# Patient Record
Sex: Male | Born: 1980
Health system: Southern US, Community
[De-identification: ages and names within clinical notes are randomized; demographics above are authoritative.]

## PROBLEM LIST (undated history)

## (undated) DIAGNOSIS — G4700 Insomnia, unspecified: Secondary | ICD-10-CM

## (undated) DIAGNOSIS — F419 Anxiety disorder, unspecified: Secondary | ICD-10-CM

## (undated) DIAGNOSIS — M549 Dorsalgia, unspecified: Secondary | ICD-10-CM

## (undated) DIAGNOSIS — G8929 Other chronic pain: Secondary | ICD-10-CM

## (undated) DIAGNOSIS — K219 Gastro-esophageal reflux disease without esophagitis: Secondary | ICD-10-CM

## (undated) DIAGNOSIS — Z87442 Personal history of urinary calculi: Secondary | ICD-10-CM

## (undated) DIAGNOSIS — E785 Hyperlipidemia, unspecified: Secondary | ICD-10-CM

## (undated) DIAGNOSIS — R531 Weakness: Secondary | ICD-10-CM

## (undated) DIAGNOSIS — I10 Essential (primary) hypertension: Secondary | ICD-10-CM

## (undated) HISTORY — PX: HERNIA REPAIR: SHX51

---

## 2003-01-11 ENCOUNTER — Ambulatory Visit (HOSPITAL_COMMUNITY): Admission: RE | Admit: 2003-01-11 | Discharge: 2003-01-11 | Payer: Self-pay | Admitting: General Surgery

## 2003-01-17 ENCOUNTER — Emergency Department (HOSPITAL_COMMUNITY): Admission: EM | Admit: 2003-01-17 | Discharge: 2003-01-17 | Payer: Self-pay | Admitting: Emergency Medicine

## 2005-05-24 ENCOUNTER — Ambulatory Visit: Payer: Self-pay | Admitting: Family Medicine

## 2006-04-02 ENCOUNTER — Emergency Department: Payer: Self-pay | Admitting: Emergency Medicine

## 2014-03-10 ENCOUNTER — Emergency Department (HOSPITAL_BASED_OUTPATIENT_CLINIC_OR_DEPARTMENT_OTHER): Payer: BC Managed Care – PPO

## 2014-03-10 ENCOUNTER — Encounter (HOSPITAL_BASED_OUTPATIENT_CLINIC_OR_DEPARTMENT_OTHER): Payer: Self-pay | Admitting: Emergency Medicine

## 2014-03-10 ENCOUNTER — Emergency Department (HOSPITAL_BASED_OUTPATIENT_CLINIC_OR_DEPARTMENT_OTHER)
Admission: EM | Admit: 2014-03-10 | Discharge: 2014-03-11 | Disposition: A | Discharge status: Home / Self Care | Payer: BC Managed Care – PPO | Attending: Emergency Medicine | Admitting: Emergency Medicine

## 2014-03-10 DIAGNOSIS — Z79899 Other long term (current) drug therapy: Secondary | ICD-10-CM | POA: Insufficient documentation

## 2014-03-10 DIAGNOSIS — R1011 Right upper quadrant pain: Secondary | ICD-10-CM

## 2014-03-10 DIAGNOSIS — R1013 Epigastric pain: Secondary | ICD-10-CM

## 2014-03-10 DIAGNOSIS — I10 Essential (primary) hypertension: Secondary | ICD-10-CM | POA: Insufficient documentation

## 2014-03-10 DIAGNOSIS — Z880 Allergy status to penicillin: Secondary | ICD-10-CM | POA: Insufficient documentation

## 2014-03-10 DIAGNOSIS — E785 Hyperlipidemia, unspecified: Secondary | ICD-10-CM | POA: Insufficient documentation

## 2014-03-10 HISTORY — DX: Hyperlipidemia, unspecified: E78.5

## 2014-03-10 HISTORY — DX: Essential (primary) hypertension: I10

## 2014-03-10 LAB — CBC WITH DIFFERENTIAL/PLATELET
Basophils Absolute: 0 10*3/uL (ref 0.0–0.1)
Basophils Relative: 0 % (ref 0–1)
EOS ABS: 0.3 10*3/uL (ref 0.0–0.7)
Eosinophils Relative: 3 % (ref 0–5)
HCT: 44.9 % (ref 39.0–52.0)
Hemoglobin: 15.6 g/dL (ref 13.0–17.0)
Lymphocytes Relative: 31 % (ref 12–46)
Lymphs Abs: 3.3 10*3/uL (ref 0.7–4.0)
MCH: 30.7 pg (ref 26.0–34.0)
MCHC: 34.7 g/dL (ref 30.0–36.0)
MCV: 88.4 fL (ref 78.0–100.0)
MONOS PCT: 9 % (ref 3–12)
Monocytes Absolute: 1 10*3/uL (ref 0.1–1.0)
NEUTROS ABS: 6.2 10*3/uL (ref 1.7–7.7)
Neutrophils Relative %: 57 % (ref 43–77)
Platelets: 212 10*3/uL (ref 150–400)
RBC: 5.08 MIL/uL (ref 4.22–5.81)
RDW: 12.7 % (ref 11.5–15.5)
WBC: 10.8 10*3/uL — ABNORMAL HIGH (ref 4.0–10.5)

## 2014-03-10 LAB — COMPREHENSIVE METABOLIC PANEL
ALK PHOS: 53 U/L (ref 39–117)
ALT: 33 U/L (ref 0–53)
AST: 26 U/L (ref 0–37)
Albumin: 4.5 g/dL (ref 3.5–5.2)
BILIRUBIN TOTAL: 0.3 mg/dL (ref 0.3–1.2)
BUN: 17 mg/dL (ref 6–23)
CHLORIDE: 100 meq/L (ref 96–112)
CO2: 29 meq/L (ref 19–32)
Calcium: 10.3 mg/dL (ref 8.4–10.5)
Creatinine, Ser: 1.2 mg/dL (ref 0.50–1.35)
GFR, EST NON AFRICAN AMERICAN: 79 mL/min — AB (ref >90)
GLUCOSE: 110 mg/dL — AB (ref 70–99)
POTASSIUM: 4.1 meq/L (ref 3.7–5.3)
Sodium: 142 mEq/L (ref 137–147)
Total Protein: 7.7 g/dL (ref 6.0–8.3)

## 2014-03-10 LAB — LIPASE, BLOOD: Lipase: 41 U/L (ref 11–59)

## 2014-03-10 MED ORDER — FAMOTIDINE IN NACL 20-0.9 MG/50ML-% IV SOLN
20.0000 mg | Freq: Once | INTRAVENOUS | Status: AC
  Administered 2014-03-10: 20 mg via INTRAVENOUS
  Filled 2014-03-10: qty 50

## 2014-03-10 MED ORDER — SODIUM CHLORIDE 0.9 % IV SOLN
INTRAVENOUS | Status: DC
  Administered 2014-03-10: 23:00:00 via INTRAVENOUS

## 2014-03-10 MED ORDER — CYCLOBENZAPRINE HCL 10 MG PO TABS: 10.0000 mg | ORAL_TABLET | Freq: Two times a day (BID) | ORAL | Status: DC | PRN

## 2014-03-10 MED ORDER — RANITIDINE HCL 150 MG PO TABS: 150.0000 mg | ORAL_TABLET | Freq: Two times a day (BID) | ORAL | Status: DC

## 2014-03-10 NOTE — ED Provider Notes (Signed)
Medical screening examination/treatment/procedure(s) were performed by non-physician practitioner and as supervising physician I was immediately available for consultation/collaboration.   EKG Interpretation None        Gilda Creasehristopher J. Simcha Farrington, MD 03/10/14 [phone]

## 2014-03-10 NOTE — ED Provider Notes (Signed)
CSN: [id number]     Arrival date & time 03/10/14  2137 History   First MD Initiated Contact with Patient 03/10/14 2207     Chief Complaint  Patient presents with  . Abdominal Pain     (Consider location/radiation/quality/duration/timing/severity/associated sxs/prior Treatment) Patient is a 33 y.o. male presenting with abdominal pain. The history is provided by the patient.  Abdominal Pain Pain location:  RUQ Pain quality: aching and burning   Pain radiates to:  Epigastric region Onset quality:  Gradual Duration:  1 week Timing:  Constant Progression:  Worsening Chronicity:  New Relieved by:  None tried Worsened by:  Eating Ineffective treatments:  None tried Associated symptoms: no chest pain, no chills, no cough, no diarrhea, no dysuria, no fever, no nausea, no shortness of breath and no vomiting    Thomas York is a 33 y.o. male who presents to the ED with RUQ abdominal pain that started a week ago and has gotten worse. He denies nausea, vomiting or diarrhea. The pain is worse with eating greasy food. 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 Not sure if pulled a muscle or has reflux or something else. The pain increases with deep breath and movement.  Past Medical History  Diagnosis Date  . Hypertension   . Hyperlipemia    Past Surgical History  Procedure Laterality Date  . Hernia repair     History reviewed. No pertinent family history. History  Substance Use Topics  . Smoking status: Never Smoker   . Smokeless tobacco: Never Used  . Alcohol Use: Yes    Review of Systems  Constitutional: Negative for fever and chills.  HENT: Negative.   Respiratory: Negative for cough and shortness of breath.   Cardiovascular: Negative for chest pain.  Gastrointestinal: Positive for abdominal pain. Negative for nausea, vomiting and diarrhea.  Genitourinary: Negative for dysuria, urgency and frequency.  Musculoskeletal: Negative for back pain.  Skin: Negative  for rash.  Neurological: Negative for headaches.  Psychiatric/Behavioral: Negative for confusion.      Allergies  Penicillins  Home Medications   Current Outpatient Rx  Name  Route  Sig  Dispense  Refill  . lisinopril (PRINIVIL,ZESTRIL) 10 MG tablet   Oral   Take 10 mg by mouth daily.         . simvastatin (ZOCOR) 20 MG tablet   Oral   Take 20 mg by mouth daily.          BP 144/72  Pulse 71  Temp(Src) 98.3 F (36.8 C) (Oral)  Resp 18  SpO2 100% Physical Exam  Nursing note and vitals reviewed. Constitutional: He is oriented to person, place, and time. He appears well-developed and well-nourished. No distress.  HENT:  Head: Normocephalic.  Eyes: EOM are normal.  Neck: Neck supple.  Cardiovascular: Normal rate and regular rhythm.   Pulmonary/Chest: Effort normal. He has no wheezes. He has no rales.  Abdominal: Soft. Bowel sounds are normal. There is tenderness in the right upper quadrant and epigastric area. There is no rebound, no guarding and no CVA tenderness.  Musculoskeletal: Normal range of motion.  Neurological: He is alert and oriented to person, place, and time. No cranial nerve deficit.  Skin: Skin is warm and dry.  Psychiatric: He has a normal mood and affect. His behavior is normal.   Results for orders placed during the hospital encounter of 03/10/14 (from the past 24 hour(s))  CBC WITH DIFFERENTIAL  Status: Abnormal   Collection Time    03/10/14 10:26 PM      Result Value Ref Range   WBC 10.8 (*) 4.0 - 10.5 K/uL   RBC 5.08  4.22 - 5.81 MIL/uL   Hemoglobin 15.6  13.0 - 17.0 g/dL   HCT 16.1  09.6 - 04.5 %   MCV 88.4  78.0 - 100.0 fL   MCH 30.7  26.0 - 34.0 pg   MCHC 34.7  30.0 - 36.0 g/dL   RDW 40.9  81.1 - 91.4 %   Platelets 212  150 - 400 K/uL   Neutrophils Relative % 57  43 - 77 %   Neutro Abs 6.2  1.7 - 7.7 K/uL   Lymphocytes Relative 31  12 - 46 %   Lymphs Abs 3.3  0.7 - 4.0 K/uL   Monocytes Relative 9  3 - 12 %   Monocytes Absolute  1.0  0.1 - 1.0 K/uL   Eosinophils Relative 3  0 - 5 %   Eosinophils Absolute 0.3  0.0 - 0.7 K/uL   Basophils Relative 0  0 - 1 %   Basophils Absolute 0.0  0.0 - 0.1 K/uL  COMPREHENSIVE METABOLIC PANEL     Status: Abnormal   Collection Time    03/10/14 10:26 PM      Result Value Ref Range   Sodium 142  137 - 147 mEq/L   Potassium 4.1  3.7 - 5.3 mEq/L   Chloride 100  96 - 112 mEq/L   CO2 29  19 - 32 mEq/L   Glucose, Bld 110 (*) 70 - 99 mg/dL   BUN 17  6 - 23 mg/dL   Creatinine, Ser 7.82  0.50 - 1.35 mg/dL   Calcium 95.6  8.4 - 21.3 mg/dL   Total Protein 7.7  6.0 - 8.3 g/dL   Albumin 4.5  3.5 - 5.2 g/dL   AST 26  0 - 37 U/L   ALT 33  0 - 53 U/L   Alkaline Phosphatase 53  39 - 117 U/L   Total Bilirubin 0.3  0.3 - 1.2 mg/dL   GFR calc non Af Amer 79 (*) >90 mL/min   GFR calc Af Amer >90  >90 mL/min  LIPASE, BLOOD     Status: None   Collection Time    03/10/14 10:26 PM      Result Value Ref Range   Lipase 41  11 - 59 U/L   US Abdomen Complete  03/10/2014   CLINICAL DATA:  One week history of right upper quadrant pain  EXAM: ULTRASOUND ABDOMEN COMPLETE  COMPARISON:  Prior abdominal radiograph 08/08/2012  FINDINGS: Gallbladder:  No gallstones or wall thickening visualized. No sonographic Murphy sign noted. The gallbladder is slightly contracted. The patient last ate at 6 p.m.  Common bile duct:  Diameter: 2.8 mm which is within normal limits.  Liver:  No focal lesion identified. Within normal limits in parenchymal echogenicity. The main portal vein is patent with normal hepatopetal flow.  IVC:  No abnormality visualized.  Pancreas:  Visualized portion unremarkable.  Spleen:  Size and appearance within normal limits.  Right Kidney:  Length: 12.4 cm. Echogenicity within normal limits. No mass or hydronephrosis visualized.  Left Kidney:  Length: 12.2 cm. Echogenicity within normal limits. No mass or hydronephrosis visualized.  Abdominal aorta:  No aneurysm visualized.  Other findings:  None.   IMPRESSION: Negative abdominal ultrasound.   Electronically Signed   By: Isac Caddy.D.  On: 03/10/2014 23:02     ED Course  Procedures IV pepcid that did help the epigastric pain  MDM  33 y.o. male with RUQ abdominal pain that started one week ago. I have reviewed this patient's vital signs, nurses notes, appropriate labs and imaging. Will treat for reflux an possible muscle strain.  I have discussed findings and plan of care with the patient and he voices understanding. Will start on Zantac and will give muscle relaxant.    Medication List    TAKE these medications       cyclobenzaprine 10 MG tablet  Commonly known as:  FLEXERIL  Take 1 tablet (10 mg total) by mouth 2 (two) times daily as needed for muscle spasms.     ranitidine 150 MG tablet  Commonly known as:  ZANTAC  Take 1 tablet (150 mg total) by mouth 2 (two) times daily.      ASK your doctor about these medications       lisinopril 10 MG tablet  Commonly known as:  PRINIVIL,ZESTRIL  Take 10 mg by mouth daily.     simvastatin 20 MG tablet  Commonly known as:  ZOCOR  Take 20 mg by mouth daily.          [place] M Brynda Heick, [place]NP 03/10/14 [phone]

## 2014-03-10 NOTE — Discharge Instructions (Signed)
Take the medication as directed. Follow up with your doctor. Do not take the muscle relaxant if you are driving as it will make you sleepy.

## 2014-03-10 NOTE — ED Notes (Signed)
Pt reports abd soreness x1 week that has progress to RUQ abd pain denies N/V/D, or event or injury to abd

## 2014-12-15 ENCOUNTER — Encounter (HOSPITAL_COMMUNITY): Payer: Self-pay | Admitting: *Deleted

## 2014-12-15 ENCOUNTER — Emergency Department (HOSPITAL_COMMUNITY)
Admission: EM | Admit: 2014-12-15 | Discharge: 2014-12-15 | Disposition: A | Discharge status: Home / Self Care | Payer: BC Managed Care – PPO | Attending: Emergency Medicine | Admitting: Emergency Medicine

## 2014-12-15 DIAGNOSIS — M5441 Lumbago with sciatica, right side: Secondary | ICD-10-CM | POA: Diagnosis not present

## 2014-12-15 DIAGNOSIS — M5431 Sciatica, right side: Secondary | ICD-10-CM

## 2014-12-15 DIAGNOSIS — Z880 Allergy status to penicillin: Secondary | ICD-10-CM | POA: Diagnosis not present

## 2014-12-15 DIAGNOSIS — Z79899 Other long term (current) drug therapy: Secondary | ICD-10-CM | POA: Insufficient documentation

## 2014-12-15 DIAGNOSIS — M545 Low back pain, unspecified: Secondary | ICD-10-CM

## 2014-12-15 DIAGNOSIS — M79604 Pain in right leg: Secondary | ICD-10-CM | POA: Insufficient documentation

## 2014-12-15 DIAGNOSIS — R200 Anesthesia of skin: Secondary | ICD-10-CM | POA: Diagnosis not present

## 2014-12-15 DIAGNOSIS — E785 Hyperlipidemia, unspecified: Secondary | ICD-10-CM | POA: Insufficient documentation

## 2014-12-15 DIAGNOSIS — Z791 Long term (current) use of non-steroidal anti-inflammatories (NSAID): Secondary | ICD-10-CM | POA: Diagnosis not present

## 2014-12-15 DIAGNOSIS — I10 Essential (primary) hypertension: Secondary | ICD-10-CM | POA: Insufficient documentation

## 2014-12-15 MED ORDER — OXYCODONE-ACETAMINOPHEN 5-325 MG PO TABS
1.0000 | ORAL_TABLET | Freq: Once | ORAL | Status: AC
  Administered 2014-12-15: 1 via ORAL
  Filled 2014-12-15: qty 1

## 2014-12-15 MED ORDER — DIAZEPAM 5 MG PO TABS: 5.0000 mg | ORAL_TABLET | Freq: Four times a day (QID) | ORAL | Status: DC | PRN

## 2014-12-15 MED ORDER — HYDROMORPHONE HCL 1 MG/ML IJ SOLN
1.0000 mg | Freq: Once | INTRAMUSCULAR | Status: AC
  Administered 2014-12-15: 1 mg via INTRAMUSCULAR
  Filled 2014-12-15: qty 1

## 2014-12-15 MED ORDER — HYDROCODONE-ACETAMINOPHEN 5-325 MG PO TABS: 2.0000 | ORAL_TABLET | ORAL | Status: DC | PRN

## 2014-12-15 MED ORDER — NAPROXEN 500 MG PO TABS: 500.0000 mg | ORAL_TABLET | Freq: Two times a day (BID) | ORAL | Status: DC

## 2014-12-15 MED ORDER — DIAZEPAM 5 MG PO TABS
5.0000 mg | ORAL_TABLET | Freq: Once | ORAL | Status: AC
  Administered 2014-12-15: 5 mg via ORAL
  Filled 2014-12-15: qty 1

## 2014-12-15 MED ORDER — KETOROLAC TROMETHAMINE 60 MG/2ML IM SOLN
60.0000 mg | Freq: Once | INTRAMUSCULAR | Status: AC
  Administered 2014-12-15: 60 mg via INTRAMUSCULAR
  Filled 2014-12-15: qty 2

## 2014-12-15 NOTE — ED Provider Notes (Signed)
CSN: [id number]     Arrival date & time 12/15/14  1410 History   First MD Initiated Contact with Patient 12/15/14 1727     Chief Complaint  Patient presents with  . Leg Pain     (Consider location/radiation/quality/duration/timing/severity/associated sxs/prior Treatment) HPI Comments: 34 year old male with history of lumbar disc herniation, high blood pressure, nonsmoker presents with right buttock pain radiating down the right leg to his foot. No weakness, mild numbness and lateral lower leg, new urinary or bowel changes, no fevers or IV drug use history, no recent injuries or back surgery. Patient has tried narcotics however pain is worsening. Worse with movement palpation.  Patient is a 35 y.o. male presenting with leg pain. The history is provided by the patient.  Leg Pain Associated symptoms: back pain   Associated symptoms: no fever and no neck pain     Past Medical History  Diagnosis Date  . Hypertension   . Hyperlipemia    Past Surgical History  Procedure Laterality Date  . Hernia repair     History reviewed. No pertinent family history. History  Substance Use Topics  . Smoking status: Never Smoker   . Smokeless tobacco: Never Used  . Alcohol Use: Yes    Review of Systems  Constitutional: Negative for fever and chills.  HENT: Negative for congestion.   Eyes: Negative for visual disturbance.  Respiratory: Negative for shortness of breath.   Cardiovascular: Negative for chest pain.  Gastrointestinal: Negative for vomiting and abdominal pain.  Genitourinary: Negative for dysuria and flank pain.  Musculoskeletal: Positive for back pain. Negative for neck pain and neck stiffness.  Skin: Negative for rash.  Neurological: Positive for numbness. Negative for speech difficulty, weakness, light-headedness and headaches.      Allergies  Penicillins  Home Medications   Prior to Admission medications   Medication Sig Start Date End Date Taking? Authorizing Provider   atorvastatin (LIPITOR) 20 MG tablet Take 20 mg by mouth daily. 10/22/14  Yes Historical Provider, MD  cyclobenzaprine (FLEXERIL) 10 MG tablet Take 1 tablet (10 mg total) by mouth 2 (two) times daily as needed for muscle spasms. 03/10/14  Yes Hope Orlene Och, NP  fenofibrate micronized (ANTARA) 130 MG capsule Take 130 mg by mouth daily. 10/22/14  Yes Historical Provider, MD  HYDROcodone-acetaminophen (NORCO) 7.5-325 MG per tablet Take 1 tablet by mouth every 6 (six) hours as needed (pain).  12/12/14  Yes Historical Provider, MD  lisinopril (PRINIVIL,ZESTRIL) 10 MG tablet Take 5 mg by mouth daily.    Yes Historical Provider, MD  diazepam (VALIUM) 5 MG tablet Take 1 tablet (5 mg total) by mouth every 6 (six) hours as needed for muscle spasms (spasms). 12/15/14   Enid Skeens, MD  HYDROcodone-acetaminophen (NORCO) 5-325 MG per tablet Take 2 tablets by mouth every 4 (four) hours as needed. 12/15/14   Enid Skeens, MD  naproxen (NAPROSYN) 500 MG tablet Take 1 tablet (500 mg total) by mouth 2 (two) times daily. 12/15/14   Enid Skeens, MD  ranitidine (ZANTAC) 150 MG tablet Take 1 tablet (150 mg total) by mouth 2 (two) times daily. Patient not taking: Reported on 12/15/2014 03/10/14   Janne Napoleon, NP   BP 156/83 mmHg  Pulse 63  Temp(Src) 98 F (36.7 C) (Oral)  Resp 16  SpO2 97% Physical Exam  Constitutional: He is oriented to person, place, and time. He appears well-developed and well-nourished.  HENT:  Head: Normocephalic and atraumatic.  Eyes: Conjunctivae are normal. Right  eye exhibits no discharge. Left eye exhibits no discharge.  Neck: Normal range of motion. Neck supple. No tracheal deviation present.  Cardiovascular: Normal rate and regular rhythm.   Pulmonary/Chest: Effort normal and breath sounds normal.  Abdominal: Soft. He exhibits no distension. There is no tenderness. There is no guarding.  Musculoskeletal: He exhibits tenderness. He exhibits no edema.  Tender right by Dr., no midline  lumbar tenderness, mild pain with flexion of right hip. Patient has 5+ strength with flexion extension of hips, knees, great toes bilateral.  Neurological: He is alert and oriented to person, place, and time. No sensory deficit. GCS eye subscore is 4. GCS verbal subscore is 5. GCS motor subscore is 6.  Reflex Scores:      Patellar reflexes are 2+ on the right side and 2+ on the left side.      Achilles reflexes are 1+ on the right side and 1+ on the left side. Skin: Skin is warm. No rash noted.  Psychiatric: He has a normal mood and affect.  Nursing note and vitals reviewed.   ED Course  Procedures (including critical care time) Labs Review Labs Reviewed - No data to display  Imaging Review No results found.   EKG Interpretation None      MDM   Final diagnoses:  Sciatica, right  Lumbar pain with radiation down right leg   Patient presents with concern for sciatica/bulging disc, no acute weakness or incontinence, no red flags at this time. Pain medicines given in ER, patient improved, supportive care and medicines for home with outpatient follow up with orthopedics discussed. Discussed strict reasons to return, no emergent MRI are indicated at this time.  Results and differential diagnosis were discussed with the patient/parent/guardian. Close follow up outpatient was discussed, comfortable with the plan.   Medications  oxyCODONE-acetaminophen (PERCOCET/ROXICET) 5-325 MG per tablet 1 tablet (1 tablet Oral Given 12/15/14 1425)  ketorolac (TORADOL) injection 60 mg (60 mg Intramuscular Given 12/15/14 1832)  HYDROmorphone (DILAUDID) injection 1 mg (1 mg Intramuscular Given 12/15/14 1833)  diazepam (VALIUM) tablet 5 mg (5 mg Oral Given 12/15/14 1832)    Filed Vitals:   12/15/14 1419 12/15/14 1836  BP: 155/86 156/83  Pulse: 77 63  Temp: 97.8 F (36.6 C) 98 F (36.7 C)  TempSrc: Oral   Resp: 18 16  SpO2: 97% 97%    Final diagnoses:  Sciatica, right  Lumbar pain with radiation  down right leg        Enid Skeens, MD 12/15/14 1610

## 2014-12-15 NOTE — ED Notes (Addendum)
Pt reports pain to right buttock that radiates down right leg and into his foot. Has been to [facility] hospital and given vicodin but no relief. Denies injury to back recently and denies incontinence. Has numbness to right foot.

## 2014-12-15 NOTE — Discharge Instructions (Signed)
If you were given medicines take as directed.  If you are on coumadin or contraceptives realize their levels and effectiveness is altered by many different medicines.  If you have any reaction (rash, tongues swelling, other) to the medicines stop taking and see a physician.   Please follow up as directed and return to the ER or see a physician for new or worsening symptoms such as incontinence, fevers, weakness.  Thank you. Filed Vitals:   12/15/14 1419  BP: 155/86  Pulse: 77  Temp: 97.8 F (36.6 C)  TempSrc: Oral  Resp: 18  SpO2: 97%

## 2015-01-13 ENCOUNTER — Ambulatory Visit: Payer: BLUE CROSS/BLUE SHIELD | Attending: Orthopedic Surgery | Admitting: Physical Therapy

## 2015-01-13 DIAGNOSIS — M5441 Lumbago with sciatica, right side: Secondary | ICD-10-CM | POA: Insufficient documentation

## 2015-01-13 DIAGNOSIS — M5126 Other intervertebral disc displacement, lumbar region: Secondary | ICD-10-CM | POA: Diagnosis not present

## 2015-01-16 ENCOUNTER — Ambulatory Visit: Payer: BLUE CROSS/BLUE SHIELD | Admitting: *Deleted

## 2015-01-16 DIAGNOSIS — M5441 Lumbago with sciatica, right side: Secondary | ICD-10-CM | POA: Diagnosis not present

## 2015-01-20 ENCOUNTER — Ambulatory Visit: Payer: BLUE CROSS/BLUE SHIELD | Admitting: Physical Therapy

## 2015-01-20 DIAGNOSIS — M5441 Lumbago with sciatica, right side: Secondary | ICD-10-CM | POA: Diagnosis not present

## 2015-01-23 ENCOUNTER — Ambulatory Visit: Payer: BLUE CROSS/BLUE SHIELD | Admitting: Physical Therapy

## 2015-01-23 DIAGNOSIS — M5441 Lumbago with sciatica, right side: Secondary | ICD-10-CM | POA: Diagnosis not present

## 2015-01-27 ENCOUNTER — Encounter: Payer: BLUE CROSS/BLUE SHIELD | Admitting: Physical Therapy

## 2015-02-25 ENCOUNTER — Other Ambulatory Visit: Payer: Self-pay | Admitting: Family Medicine

## 2015-02-25 DIAGNOSIS — M549 Dorsalgia, unspecified: Secondary | ICD-10-CM

## 2015-02-25 DIAGNOSIS — M5430 Sciatica, unspecified side: Secondary | ICD-10-CM

## 2015-02-25 DIAGNOSIS — R531 Weakness: Secondary | ICD-10-CM

## 2015-03-08 ENCOUNTER — Other Ambulatory Visit: Payer: BLUE CROSS/BLUE SHIELD

## 2015-05-14 ENCOUNTER — Emergency Department (HOSPITAL_COMMUNITY): Payer: BLUE CROSS/BLUE SHIELD

## 2015-05-14 ENCOUNTER — Emergency Department (HOSPITAL_COMMUNITY)
Admission: EM | Admit: 2015-05-14 | Discharge: 2015-05-15 | Disposition: A | Discharge status: Home / Self Care | Payer: BLUE CROSS/BLUE SHIELD | Attending: Emergency Medicine | Admitting: Emergency Medicine

## 2015-05-14 ENCOUNTER — Encounter (HOSPITAL_COMMUNITY): Payer: Self-pay

## 2015-05-14 DIAGNOSIS — Z79899 Other long term (current) drug therapy: Secondary | ICD-10-CM | POA: Insufficient documentation

## 2015-05-14 DIAGNOSIS — Z880 Allergy status to penicillin: Secondary | ICD-10-CM | POA: Diagnosis not present

## 2015-05-14 DIAGNOSIS — M79672 Pain in left foot: Secondary | ICD-10-CM | POA: Diagnosis present

## 2015-05-14 DIAGNOSIS — I10 Essential (primary) hypertension: Secondary | ICD-10-CM | POA: Insufficient documentation

## 2015-05-14 DIAGNOSIS — E785 Hyperlipidemia, unspecified: Secondary | ICD-10-CM | POA: Insufficient documentation

## 2015-05-14 NOTE — ED Notes (Signed)
Pt complains of both feet getting cold and numb for the last month, he also complains of several bulging disk in his back, pt also states that the last three days he's been unable to get an erection and he's concerned because his father had the same symptoms prior to dying of an MI

## 2015-05-14 NOTE — ED Notes (Signed)
Pt also expresses that he's been very nervous and worried about things for several weeks, he states he's lost 10 pounds from not eating and can't really tell me why or what he's worried about

## 2015-05-15 LAB — CBC WITH DIFFERENTIAL/PLATELET
BASOS PCT: 0 % (ref 0–1)
Basophils Absolute: 0 10*3/uL (ref 0.0–0.1)
EOS ABS: 0.1 10*3/uL (ref 0.0–0.7)
Eosinophils Relative: 1 % (ref 0–5)
HEMATOCRIT: 45.2 % (ref 39.0–52.0)
Hemoglobin: 15.7 g/dL (ref 13.0–17.0)
Lymphocytes Relative: 24 % (ref 12–46)
Lymphs Abs: 2.2 10*3/uL (ref 0.7–4.0)
MCH: 30.3 pg (ref 26.0–34.0)
MCHC: 34.7 g/dL (ref 30.0–36.0)
MCV: 87.3 fL (ref 78.0–100.0)
Monocytes Absolute: 0.8 10*3/uL (ref 0.1–1.0)
Monocytes Relative: 9 % (ref 3–12)
Neutro Abs: 6.2 10*3/uL (ref 1.7–7.7)
Neutrophils Relative %: 66 % (ref 43–77)
Platelets: 218 10*3/uL (ref 150–400)
RBC: 5.18 MIL/uL (ref 4.22–5.81)
RDW: 12.3 % (ref 11.5–15.5)
WBC: 9.3 10*3/uL (ref 4.0–10.5)

## 2015-05-15 LAB — BASIC METABOLIC PANEL
ANION GAP: 8 (ref 5–15)
BUN: 20 mg/dL (ref 6–20)
CO2: 23 mmol/L (ref 22–32)
CREATININE: 0.89 mg/dL (ref 0.61–1.24)
Calcium: 9.5 mg/dL (ref 8.9–10.3)
Chloride: 104 mmol/L (ref 101–111)
GFR calc non Af Amer: >60 mL/min (ref >60)
GLUCOSE: 109 mg/dL — AB (ref 65–99)
POTASSIUM: 3.9 mmol/L (ref 3.5–5.1)
Sodium: 135 mmol/L (ref 135–145)

## 2015-05-15 NOTE — ED Provider Notes (Signed)
CSN: [id number]     Arrival date & time 05/14/15  2039 History   First MD Initiated Contact with Patient 05/14/15 2353     Chief Complaint  Patient presents with  . Foot Pain     (Consider location/radiation/quality/duration/timing/severity/associated sxs/prior Treatment) The history is provided by the patient and a parent. No language interpreter was used.   Mr. Carleene Cooperuggle is a 34 year old male with a history of hypertension and hyperlipidemia who presents for multiple complaints. He states he had noticed a lump on his left foot after getting out of the shower tonight. He also states he has been under a lot of stress.  He has been evaluated for back pain recently by Dr. Shon BatonBrooks and told that he would need surgery.  He is having a difficult time deciding on the need for surgery.  He is also complaining of a single episode of chest pain that lasted a few seconds while at work 2 months ago.  He denies any recent chest pain or shortness of breath.  He also says that he is unable to get an erection lately. He states his father had the same problem before he died.  He denies any dizziness, light headedness, fever, chills, chest pain, shortness of breath, abdominal pain, dysuria, hematuria, leg swelling. Past Medical History  Diagnosis Date  . Hypertension   . Hyperlipemia    Past Surgical History  Procedure Laterality Date  . Hernia repair     History reviewed. No pertinent family history. History  Substance Use Topics  . Smoking status: Never Smoker   . Smokeless tobacco: Never Used  . Alcohol Use: Yes    Review of Systems  Genitourinary: Negative for dysuria, urgency, frequency, hematuria, flank pain, decreased urine volume, discharge, penile swelling, scrotal swelling, difficulty urinating, genital sores, penile pain and testicular pain.  All other systems reviewed and are negative.     Allergies  Penicillins  Home Medications   Prior to Admission medications   Medication Sig Start  Date End Date Taking? Authorizing Provider  atorvastatin (LIPITOR) 20 MG tablet Take 20 mg by mouth daily. 10/22/14  Yes Historical Provider, MD  fenofibrate micronized (ANTARA) 130 MG capsule Take 130 mg by mouth daily. 10/22/14  Yes Historical Provider, MD  ibuprofen (ADVIL,MOTRIN) 200 MG tablet Take 800 mg by mouth every 6 (six) hours as needed for mild pain.   Yes Historical Provider, MD  lisinopril (PRINIVIL,ZESTRIL) 10 MG tablet Take 5 mg by mouth daily.    Yes Historical Provider, MD  cyclobenzaprine (FLEXERIL) 10 MG tablet Take 1 tablet (10 mg total) by mouth 2 (two) times daily as needed for muscle spasms. Patient not taking: Reported on 05/15/2015 03/10/14   Janne NapoleonHope M Neese, NP  diazepam (VALIUM) 5 MG tablet Take 1 tablet (5 mg total) by mouth every 6 (six) hours as needed for muscle spasms (spasms). Patient not taking: Reported on 05/15/2015 12/15/14   Blane OharaJoshua Zavitz, MD  HYDROcodone-acetaminophen [facility](NORCO) 5-325 MG per tablet Take 2 tablets by mouth every 4 (four) hours as needed. Patient not taking: Reported on 05/15/2015 12/15/14   Blane OharaJoshua Zavitz, MD  naproxen (NAPROSYN) 500 MG tablet Take 1 tablet (500 mg total) by mouth 2 (two) times daily. Patient not taking: Reported on 05/15/2015 12/15/14   Blane OharaJoshua Zavitz, MD  ranitidine (ZANTAC) 150 MG tablet Take 1 tablet (150 mg total) by mouth 2 (two) times daily. Patient not taking: Reported on 12/15/2014 03/10/14   Janne NapoleonHope M Neese, NP   BP 160/83 mmHg  Pulse 79  Temp(Src) 98 F (36.7 C) (Oral)  Resp 16  SpO2 99% Physical Exam  Constitutional: He is oriented to person, place, and time. He appears well-developed and well-nourished.  HENT:  Head: Normocephalic and atraumatic.  Eyes: Conjunctivae are normal.  Neck: Normal range of motion. Neck supple.  Cardiovascular: Normal rate, regular rhythm and normal heart sounds.   Pulmonary/Chest: Effort normal and breath sounds normal. No respiratory distress. He has no wheezes. He has no rales.  Abdominal: Soft. There  is no tenderness.  Musculoskeletal: Normal range of motion. He exhibits no edema.  Bilateral lower extremity strength is normal.  Decreased sensation in the right great toe. Normal DP pulses bilaterally.  No edema, erythema, or tenderness to the feet. Normal ROM of ankle and toes.   Neurological: He is alert and oriented to person, place, and time.  Skin: Skin is warm and dry.  Nursing note and vitals reviewed.   ED Course  Procedures (including critical care time) Labs Review Labs Reviewed  BASIC METABOLIC PANEL - Abnormal; Notable for the following:    Glucose, Bld 109 (*)    All other components within normal limits  CBC WITH DIFFERENTIAL/PLATELET    Imaging Review Dg Foot Complete Left  05/15/2015   CLINICAL DATA:  Painful lump on dorsum of foot today, no injury.  EXAM: LEFT FOOT - COMPLETE 3+ VIEW  COMPARISON:  LEFT foot radiograph December 11, 2013  FINDINGS: No acute fracture deformity or dislocation. Joint space intact without erosions. No destructive bony lesions. Soft tissue planes are not suspicious.  IMPRESSION: Negative.   Electronically Signed   By: Awilda Metro M.D.   On: 05/15/2015 00:16     EKG Interpretation None      MDM   Final diagnoses:  Left foot pain  Patient presents for multiple complaints but the reason for coming today is left foot lump.  He states he noticed it after getting out of the shower tonight.  His xray is negative for fracture or soft tissue swelling and his physical exam is normal. I obtained baseline labs to reassure the patient that his electrolytes and glucose were normal. He denies any recent chest pain or shortness of breath.  He states he has been under a lot of stress recently and worried about a lot of things.    He had a recent MRI of his back and is followed by Dr. Shon Baton, his orthopedist.  He states he needs surgery and has had decreased sensation in his right foot for months now.  He states that he cannot decide if he wants to  get surgery or not and it has been keeping him up at night.  He states he can't shut down his thoughts at night.  He also states he has not been able to get an erection for the past few weeks.  I discussed this patient with Dr. Elesa Massed. I discussed with the patient that he should follow up with his pcp regarding his trouble getting an erection and that he should talk to his pcp about a stress test or further cardiac workup for the chest pain that occurred months ago.  He should also follow up with his orthopedist about his right leg numbness if he thinks he would benefit from the surgery. His labs are unremarkable and reassuring to him.       Catha Gosselin, PA-C 05/15/15 1545  Layla Maw Ward, DO 05/16/15 [phone]

## 2015-05-15 NOTE — Discharge Instructions (Signed)
Musculoskeletal Pain, Foot pain Follow up with your PCP for possible stress test to further evaluate your prior chest pain symptoms and inability to get an erection.  Musculoskeletal pain is muscle and boney aches and pains. These pains can occur in any part of the body. Your caregiver may treat you without knowing the cause of the pain. They may treat you if blood or urine tests, X-rays, and other tests were normal.  CAUSES There is often not a definite cause or reason for these pains. These pains may be caused by a type of germ (virus). The discomfort may also come from overuse. Overuse includes working out too hard when your body is not fit. Boney aches also come from weather changes. Bone is sensitive to atmospheric pressure changes. HOME CARE INSTRUCTIONS   Ask when your test results will be ready. Make sure you get your test results.  Only take over-the-counter or prescription medicines for pain, discomfort, or fever as directed by your caregiver. If you were given medications for your condition, do not drive, operate machinery or power tools, or sign legal documents for 24 hours. Do not drink alcohol. Do not take sleeping pills or other medications that may interfere with treatment.  Continue all activities unless the activities cause more pain. When the pain lessens, slowly resume normal activities. Gradually increase the intensity and duration of the activities or exercise.  During periods of severe pain, bed rest may be helpful. Lay or sit in any position that is comfortable.  Putting ice on the injured area.  Put ice in a bag.  Place a towel between your skin and the bag.  Leave the ice on for 15 to 20 minutes, 3 to 4 times a day.  Follow up with your caregiver for continued problems and no reason can be found for the pain. If the pain becomes worse or does not go away, it may be necessary to repeat tests or do additional testing. Your caregiver may need to look further for a  possible cause. SEEK IMMEDIATE MEDICAL CARE IF:  You have pain that is getting worse and is not relieved by medications.  You develop chest pain that is associated with shortness or breath, sweating, feeling sick to your stomach (nauseous), or throw up (vomit).  Your pain becomes localized to the abdomen.  You develop any new symptoms that seem different or that concern you. MAKE SURE YOU:   Understand these instructions.  Will watch your condition.  Will get help right away if you are not doing well or get worse. Document Released: 11/29/2005 Document Revised: 02/21/2012 Document Reviewed: 08/03/2013 [facility] Patient Information 2015 [place], [place]. This information is not intended to replace advice given to you by your health care provider. Make sure you discuss any questions you have with your health care provider.

## 2015-05-27 ENCOUNTER — Encounter (HOSPITAL_COMMUNITY): Payer: Self-pay | Admitting: *Deleted

## 2015-05-27 NOTE — Progress Notes (Signed)
Denies having a cardiologist  Denies ever having an echo/stress test/heart cath  Medical Md is Dr.Kristen Arlyce Dice in [place]  EKG requested from Medical Md [phone]

## 2015-05-28 ENCOUNTER — Ambulatory Visit (HOSPITAL_COMMUNITY): Payer: BLUE CROSS/BLUE SHIELD

## 2015-05-28 ENCOUNTER — Encounter (HOSPITAL_COMMUNITY): Payer: Self-pay | Admitting: *Deleted

## 2015-05-28 ENCOUNTER — Ambulatory Visit (HOSPITAL_COMMUNITY): Payer: BLUE CROSS/BLUE SHIELD | Admitting: Anesthesiology

## 2015-05-28 ENCOUNTER — Encounter (HOSPITAL_COMMUNITY)
Admission: RE | Disposition: A | Discharge status: Home / Self Care | Payer: BLUE CROSS/BLUE SHIELD | Source: Ambulatory Visit | Attending: Orthopedic Surgery

## 2015-05-28 ENCOUNTER — Inpatient Hospital Stay (HOSPITAL_COMMUNITY)
Admission: RE | Admit: 2015-05-28 | Discharge: 2015-05-29 | DRG: 517 | Disposition: A | Discharge status: Home / Self Care | Payer: BLUE CROSS/BLUE SHIELD | Source: Ambulatory Visit | Attending: Orthopedic Surgery | Admitting: Orthopedic Surgery

## 2015-05-28 DIAGNOSIS — I10 Essential (primary) hypertension: Secondary | ICD-10-CM | POA: Diagnosis present

## 2015-05-28 DIAGNOSIS — Z79899 Other long term (current) drug therapy: Secondary | ICD-10-CM | POA: Diagnosis not present

## 2015-05-28 DIAGNOSIS — M4807 Spinal stenosis, lumbosacral region: Secondary | ICD-10-CM | POA: Diagnosis present

## 2015-05-28 DIAGNOSIS — Z880 Allergy status to penicillin: Secondary | ICD-10-CM | POA: Diagnosis not present

## 2015-05-28 DIAGNOSIS — N529 Male erectile dysfunction, unspecified: Secondary | ICD-10-CM | POA: Diagnosis present

## 2015-05-28 DIAGNOSIS — M5126 Other intervertebral disc displacement, lumbar region: Secondary | ICD-10-CM | POA: Diagnosis present

## 2015-05-28 DIAGNOSIS — K219 Gastro-esophageal reflux disease without esophagitis: Secondary | ICD-10-CM | POA: Diagnosis present

## 2015-05-28 DIAGNOSIS — M5117 Intervertebral disc disorders with radiculopathy, lumbosacral region: Principal | ICD-10-CM | POA: Diagnosis present

## 2015-05-28 DIAGNOSIS — M549 Dorsalgia, unspecified: Secondary | ICD-10-CM | POA: Diagnosis present

## 2015-05-28 DIAGNOSIS — Z419 Encounter for procedure for purposes other than remedying health state, unspecified: Secondary | ICD-10-CM

## 2015-05-28 HISTORY — PX: LUMBAR LAMINECTOMY/DECOMPRESSION MICRODISCECTOMY: SHX5026

## 2015-05-28 HISTORY — DX: Anxiety disorder, unspecified: F41.9

## 2015-05-28 HISTORY — DX: Weakness: R53.1

## 2015-05-28 HISTORY — DX: Dorsalgia, unspecified: M54.9

## 2015-05-28 HISTORY — DX: Other chronic pain: G89.29

## 2015-05-28 HISTORY — DX: Personal history of urinary calculi: Z87.442

## 2015-05-28 HISTORY — DX: Insomnia, unspecified: G47.00

## 2015-05-28 HISTORY — DX: Gastro-esophageal reflux disease without esophagitis: K21.9

## 2015-05-28 LAB — SURGICAL PCR SCREEN
MRSA, PCR: NEGATIVE
Staphylococcus aureus: NEGATIVE

## 2015-05-28 SURGERY — LUMBAR LAMINECTOMY/DECOMPRESSION MICRODISCECTOMY 3 LEVELS
Anesthesia: General | Site: Back

## 2015-05-28 MED ORDER — DEXAMETHASONE SODIUM PHOSPHATE 10 MG/ML IJ SOLN
INTRAMUSCULAR | Status: AC
  Filled 2015-05-28: qty 1

## 2015-05-28 MED ORDER — PROPOFOL 10 MG/ML IV BOLUS
INTRAVENOUS | Status: AC
  Filled 2015-05-28: qty 20

## 2015-05-28 MED ORDER — ACETAMINOPHEN 10 MG/ML IV SOLN
1000.0000 mg | Freq: Once | INTRAVENOUS | Status: AC
  Administered 2015-05-28: 1000 mg via INTRAVENOUS
  Filled 2015-05-28: qty 100

## 2015-05-28 MED ORDER — VECURONIUM BROMIDE 10 MG IV SOLR
INTRAVENOUS | Status: DC | PRN
  Administered 2015-05-28: 2 mg via INTRAVENOUS
  Administered 2015-05-28 (×2): 3 mg via INTRAVENOUS

## 2015-05-28 MED ORDER — OXYCODONE HCL 5 MG PO TABS
ORAL_TABLET | ORAL | Status: AC
  Filled 2015-05-28: qty 2

## 2015-05-28 MED ORDER — DEXTROSE 5 % IV SOLN
INTRAVENOUS | Status: DC | PRN
  Administered 2015-05-28: 12:00:00 via INTRAVENOUS

## 2015-05-28 MED ORDER — FENTANYL CITRATE (PF) 250 MCG/5ML IJ SOLN
INTRAMUSCULAR | Status: AC
  Filled 2015-05-28: qty 5

## 2015-05-28 MED ORDER — PHENOL 1.4 % MT LIQD: 1.0000 | OROMUCOSAL | Status: DC | PRN

## 2015-05-28 MED ORDER — VANCOMYCIN HCL IN DEXTROSE 1-5 GM/200ML-% IV SOLN
1000.0000 mg | Freq: Once | INTRAVENOUS | Status: AC
  Administered 2015-05-29: 1000 mg via INTRAVENOUS
  Filled 2015-05-28: qty 200

## 2015-05-28 MED ORDER — MORPHINE SULFATE 2 MG/ML IJ SOLN: 1.0000 mg | INTRAMUSCULAR | Status: DC | PRN

## 2015-05-28 MED ORDER — NEOSTIGMINE METHYLSULFATE 10 MG/10ML IV SOLN
INTRAVENOUS | Status: DC | PRN
  Administered 2015-05-28: 5 mg via INTRAVENOUS

## 2015-05-28 MED ORDER — GLYCOPYRROLATE 0.2 MG/ML IJ SOLN
INTRAMUSCULAR | Status: AC
  Filled 2015-05-28: qty 3

## 2015-05-28 MED ORDER — ONDANSETRON HCL 4 MG/2ML IJ SOLN
INTRAMUSCULAR | Status: DC | PRN
  Administered 2015-05-28: 4 mg via INTRAVENOUS

## 2015-05-28 MED ORDER — PROMETHAZINE HCL 25 MG/ML IJ SOLN
INTRAMUSCULAR | Status: AC
  Administered 2015-05-28: 6.25 mg via INTRAVENOUS
  Filled 2015-05-28: qty 1

## 2015-05-28 MED ORDER — MIDAZOLAM HCL 5 MG/5ML IJ SOLN
INTRAMUSCULAR | Status: DC | PRN
  Administered 2015-05-28: 2 mg via INTRAVENOUS

## 2015-05-28 MED ORDER — MENTHOL 3 MG MT LOZG: 1.0000 | LOZENGE | OROMUCOSAL | Status: DC | PRN

## 2015-05-28 MED ORDER — METHOCARBAMOL 500 MG PO TABS
ORAL_TABLET | ORAL | Status: AC
  Filled 2015-05-28: qty 1

## 2015-05-28 MED ORDER — LIDOCAINE HCL (CARDIAC) 20 MG/ML IV SOLN
INTRAVENOUS | Status: AC
  Filled 2015-05-28: qty 5

## 2015-05-28 MED ORDER — GLYCOPYRROLATE 0.2 MG/ML IJ SOLN
INTRAMUSCULAR | Status: DC | PRN
  Administered 2015-05-28: 0.6 mg via INTRAVENOUS

## 2015-05-28 MED ORDER — ETOMIDATE 2 MG/ML IV SOLN
INTRAVENOUS | Status: AC
  Filled 2015-05-28: qty 10

## 2015-05-28 MED ORDER — BUPIVACAINE-EPINEPHRINE (PF) 0.25% -1:200000 IJ SOLN
INTRAMUSCULAR | Status: AC
  Filled 2015-05-28: qty 30

## 2015-05-28 MED ORDER — VANCOMYCIN HCL IN DEXTROSE 1-5 GM/200ML-% IV SOLN
1000.0000 mg | INTRAVENOUS | Status: AC
  Administered 2015-05-28: 1000 mg via INTRAVENOUS
  Filled 2015-05-28: qty 200

## 2015-05-28 MED ORDER — NEOSTIGMINE METHYLSULFATE 10 MG/10ML IV SOLN
INTRAVENOUS | Status: AC
  Filled 2015-05-28: qty 1

## 2015-05-28 MED ORDER — LACTATED RINGERS IV SOLN
INTRAVENOUS | Status: DC | PRN
  Administered 2015-05-28 (×3): via INTRAVENOUS

## 2015-05-28 MED ORDER — SODIUM CHLORIDE 0.9 % IJ SOLN: 3.0000 mL | Freq: Two times a day (BID) | INTRAMUSCULAR | Status: DC

## 2015-05-28 MED ORDER — 0.9 % SODIUM CHLORIDE (POUR BTL) OPTIME
TOPICAL | Status: DC | PRN
  Administered 2015-05-28: 2000 mL

## 2015-05-28 MED ORDER — METHOCARBAMOL 500 MG PO TABS
500.0000 mg | ORAL_TABLET | Freq: Four times a day (QID) | ORAL | Status: DC | PRN
  Administered 2015-05-28: 500 mg via ORAL
  Filled 2015-05-28: qty 1

## 2015-05-28 MED ORDER — METHOCARBAMOL 1000 MG/10ML IJ SOLN
500.0000 mg | Freq: Four times a day (QID) | INTRAVENOUS | Status: DC | PRN
  Filled 2015-05-28: qty 5

## 2015-05-28 MED ORDER — MIDAZOLAM HCL 2 MG/2ML IJ SOLN
INTRAMUSCULAR | Status: AC
  Filled 2015-05-28: qty 2

## 2015-05-28 MED ORDER — HYDROMORPHONE HCL 1 MG/ML IJ SOLN
INTRAMUSCULAR | Status: AC
  Administered 2015-05-28: 0.5 mg via INTRAVENOUS
  Filled 2015-05-28: qty 1

## 2015-05-28 MED ORDER — ROCURONIUM BROMIDE 100 MG/10ML IV SOLN
INTRAVENOUS | Status: DC | PRN
  Administered 2015-05-28: 50 mg via INTRAVENOUS

## 2015-05-28 MED ORDER — ONDANSETRON HCL 4 MG/2ML IJ SOLN: 4.0000 mg | INTRAMUSCULAR | Status: DC | PRN

## 2015-05-28 MED ORDER — PROMETHAZINE HCL 25 MG/ML IJ SOLN
6.2500 mg | INTRAMUSCULAR | Status: DC | PRN
  Administered 2015-05-28: 6.25 mg via INTRAVENOUS

## 2015-05-28 MED ORDER — ONDANSETRON HCL 4 MG/2ML IJ SOLN
INTRAMUSCULAR | Status: AC
  Filled 2015-05-28: qty 2

## 2015-05-28 MED ORDER — THROMBIN 20000 UNITS EX SOLR
CUTANEOUS | Status: AC
  Filled 2015-05-28: qty 20000

## 2015-05-28 MED ORDER — LIDOCAINE HCL (CARDIAC) 20 MG/ML IV SOLN
INTRAVENOUS | Status: DC | PRN
  Administered 2015-05-28: 80 mg via INTRAVENOUS

## 2015-05-28 MED ORDER — LISINOPRIL 5 MG PO TABS
5.0000 mg | ORAL_TABLET | Freq: Every day | ORAL | Status: DC
  Administered 2015-05-29: 5 mg via ORAL
  Filled 2015-05-28 (×2): qty 1

## 2015-05-28 MED ORDER — HYDROMORPHONE HCL 1 MG/ML IJ SOLN
0.2500 mg | INTRAMUSCULAR | Status: DC | PRN
  Administered 2015-05-28 (×6): 0.5 mg via INTRAVENOUS

## 2015-05-28 MED ORDER — DEXAMETHASONE SODIUM PHOSPHATE 4 MG/ML IJ SOLN: 4.0000 mg | Freq: Four times a day (QID) | INTRAMUSCULAR | Status: AC

## 2015-05-28 MED ORDER — DEXAMETHASONE SODIUM PHOSPHATE 10 MG/ML IJ SOLN
INTRAMUSCULAR | Status: DC | PRN
  Administered 2015-05-28: 10 mg via INTRAVENOUS

## 2015-05-28 MED ORDER — OXYCODONE HCL 5 MG PO TABS
10.0000 mg | ORAL_TABLET | ORAL | Status: DC | PRN
  Administered 2015-05-28 – 2015-05-29 (×4): 10 mg via ORAL
  Filled 2015-05-28 (×3): qty 2

## 2015-05-28 MED ORDER — HEMOSTATIC AGENTS (NO CHARGE) OPTIME
TOPICAL | Status: DC | PRN
  Administered 2015-05-28 (×2): 1 via TOPICAL

## 2015-05-28 MED ORDER — BUPIVACAINE-EPINEPHRINE 0.25% -1:200000 IJ SOLN
INTRAMUSCULAR | Status: DC | PRN
  Administered 2015-05-28: 10 mL

## 2015-05-28 MED ORDER — SODIUM CHLORIDE 0.9 % IV SOLN: 250.0000 mL | INTRAVENOUS | Status: DC

## 2015-05-28 MED ORDER — MUPIROCIN 2 % EX OINT
1.0000 "application " | TOPICAL_OINTMENT | Freq: Once | CUTANEOUS | Status: AC
  Administered 2015-05-28: 1 via TOPICAL
  Filled 2015-05-28: qty 22

## 2015-05-28 MED ORDER — THROMBIN 20000 UNITS EX KIT
PACK | CUTANEOUS | Status: DC | PRN
  Administered 2015-05-28: 20 mL via TOPICAL

## 2015-05-28 MED ORDER — HYDROMORPHONE HCL 1 MG/ML IJ SOLN
INTRAMUSCULAR | Status: AC
  Filled 2015-05-28: qty 1

## 2015-05-28 MED ORDER — LACTATED RINGERS IV SOLN: INTRAVENOUS | Status: DC

## 2015-05-28 MED ORDER — ACETAMINOPHEN 10 MG/ML IV SOLN
1000.0000 mg | Freq: Four times a day (QID) | INTRAVENOUS | Status: DC
  Administered 2015-05-28 – 2015-05-29 (×2): 1000 mg via INTRAVENOUS
  Filled 2015-05-28 (×4): qty 100

## 2015-05-28 MED ORDER — SODIUM CHLORIDE 0.9 % IJ SOLN: 3.0000 mL | INTRAMUSCULAR | Status: DC | PRN

## 2015-05-28 MED ORDER — PROPOFOL 10 MG/ML IV BOLUS
INTRAVENOUS | Status: DC | PRN
  Administered 2015-05-28: 300 mg via INTRAVENOUS

## 2015-05-28 MED ORDER — FENTANYL CITRATE (PF) 100 MCG/2ML IJ SOLN
INTRAMUSCULAR | Status: DC | PRN
  Administered 2015-05-28 (×6): 50 ug via INTRAVENOUS
  Administered 2015-05-28: 100 ug via INTRAVENOUS
  Administered 2015-05-28: 50 ug via INTRAVENOUS
  Administered 2015-05-28: 100 ug via INTRAVENOUS

## 2015-05-28 MED ORDER — LACTATED RINGERS IV SOLN
INTRAVENOUS | Status: DC
  Administered 2015-05-28: 11:00:00 via INTRAVENOUS

## 2015-05-28 MED ORDER — DEXAMETHASONE 4 MG PO TABS
4.0000 mg | ORAL_TABLET | Freq: Four times a day (QID) | ORAL | Status: AC
  Administered 2015-05-28 – 2015-05-29 (×2): 4 mg via ORAL
  Filled 2015-05-28 (×2): qty 1

## 2015-05-28 SURGICAL SUPPLY — 66 items
ADH SKN CLS APL DERMABOND .7 (GAUZE/BANDAGES/DRESSINGS)
BUR EGG ELITE 4.0 (BURR) IMPLANT
BUR MATCHSTICK NEURO 3.0 LAGG (BURR) ×1 IMPLANT
CANISTER SUCTION 2500CC (MISCELLANEOUS) ×2 IMPLANT
CLSR STERI-STRIP ANTIMIC 1/2X4 (GAUZE/BANDAGES/DRESSINGS) ×1 IMPLANT
CORDS BIPOLAR (ELECTRODE) ×2 IMPLANT
COVER SURGICAL LIGHT HANDLE (MISCELLANEOUS) ×2 IMPLANT
DERMABOND ADVANCED (GAUZE/BANDAGES/DRESSINGS)
DERMABOND ADVANCED .7 DNX12 (GAUZE/BANDAGES/DRESSINGS) ×1 IMPLANT
DRAIN CHANNEL 15F RND FF W/TCR (WOUND CARE) ×1 IMPLANT
DRAPE POUCH INSTRU U-SHP 10X18 (DRAPES) ×2 IMPLANT
DRAPE SURG 17X23 STRL (DRAPES) ×2 IMPLANT
DRAPE U-SHAPE 47X51 STRL (DRAPES) ×2 IMPLANT
DRSG MEPILEX BORDER 4X8 (GAUZE/BANDAGES/DRESSINGS) ×2 IMPLANT
DURAPREP 26ML APPLICATOR (WOUND CARE) ×2 IMPLANT
ELECT BLADE 4.0 EZ CLEAN MEGAD (MISCELLANEOUS)
ELECT CAUTERY BLADE 6.4 (BLADE) ×2 IMPLANT
ELECT PENCIL ROCKER SW 15FT (MISCELLANEOUS) ×2 IMPLANT
ELECT REM PT RETURN 9FT ADLT (ELECTROSURGICAL) ×2
ELECTRODE BLDE 4.0 EZ CLN MEGD (MISCELLANEOUS) IMPLANT
ELECTRODE REM PT RTRN 9FT ADLT (ELECTROSURGICAL) ×1 IMPLANT
EVACUATOR 1/8 PVC DRAIN (DRAIN) IMPLANT
EVACUATOR SILICONE 100CC (DRAIN) ×1 IMPLANT
GLOVE BIOGEL PI IND STRL 8 (GLOVE) ×1 IMPLANT
GLOVE BIOGEL PI IND STRL 8.5 (GLOVE) ×1 IMPLANT
GLOVE BIOGEL PI INDICATOR 8 (GLOVE) ×1
GLOVE BIOGEL PI INDICATOR 8.5 (GLOVE) ×1
GLOVE ORTHO TXT STRL SZ7.5 (GLOVE) ×2 IMPLANT
GLOVE SS BIOGEL STRL SZ 8.5 (GLOVE) ×1 IMPLANT
GLOVE SUPERSENSE BIOGEL SZ 8.5 (GLOVE) ×1
GOWN STRL REUS W/ TWL LRG LVL3 (GOWN DISPOSABLE) ×1 IMPLANT
GOWN STRL REUS W/TWL 2XL LVL3 (GOWN DISPOSABLE) ×4 IMPLANT
GOWN STRL REUS W/TWL LRG LVL3 (GOWN DISPOSABLE) ×2
KIT BASIN OR (CUSTOM PROCEDURE TRAY) ×2 IMPLANT
KIT ROOM TURNOVER OR (KITS) ×2 IMPLANT
NDL SPNL 18GX3.5 QUINCKE PK (NEEDLE) ×2 IMPLANT
NEEDLE 22X1 1/2 (OR ONLY) (NEEDLE) ×2 IMPLANT
NEEDLE SPNL 18GX3.5 QUINCKE PK (NEEDLE) ×4 IMPLANT
NS IRRIG 1000ML POUR BTL (IV SOLUTION) ×2 IMPLANT
PACK LAMINECTOMY ORTHO (CUSTOM PROCEDURE TRAY) ×2 IMPLANT
PACK UNIVERSAL I (CUSTOM PROCEDURE TRAY) ×2 IMPLANT
PAD ARMBOARD 7.5X6 YLW CONV (MISCELLANEOUS) ×4 IMPLANT
PATTIES SURGICAL .5 X.5 (GAUZE/BANDAGES/DRESSINGS) ×1 IMPLANT
PATTIES SURGICAL .5 X1 (DISPOSABLE) IMPLANT
SPONGE LAP 4X18 X RAY DECT (DISPOSABLE) ×3 IMPLANT
SPONGE SURGIFOAM ABS GEL 100 (HEMOSTASIS) ×1 IMPLANT
STRIP CLOSURE SKIN 1/2X4 (GAUZE/BANDAGES/DRESSINGS) ×2 IMPLANT
SURGIFLO TRUKIT (HEMOSTASIS) ×2 IMPLANT
SUT BONE WAX W31G (SUTURE) ×2 IMPLANT
SUT MON AB 3-0 SH 27 (SUTURE) ×2
SUT MON AB 3-0 SH27 (SUTURE) ×1 IMPLANT
SUT VIC AB 0 CT1 27 (SUTURE)
SUT VIC AB 0 CT1 27XBRD ANBCTR (SUTURE) ×1 IMPLANT
SUT VIC AB 1 CT1 27 (SUTURE) ×2
SUT VIC AB 1 CT1 27XBRD ANBCTR (SUTURE) IMPLANT
SUT VIC AB 1 CTX 36 (SUTURE) ×2
SUT VIC AB 1 CTX36XBRD ANBCTR (SUTURE) ×2 IMPLANT
SUT VIC AB 2-0 CT1 18 (SUTURE) ×2 IMPLANT
SUT VIC AB 2-0 CT1 27 (SUTURE) ×2
SUT VIC AB 2-0 CT1 TAPERPNT 27 (SUTURE) IMPLANT
SYR BULB IRRIGATION 50ML (SYRINGE) ×2 IMPLANT
SYR CONTROL 10ML LL (SYRINGE) ×2 IMPLANT
TOWEL OR 17X24 6PK STRL BLUE (TOWEL DISPOSABLE) ×3 IMPLANT
TOWEL OR 17X26 10 PK STRL BLUE (TOWEL DISPOSABLE) ×2 IMPLANT
WATER STERILE IRR 1000ML POUR (IV SOLUTION) ×1 IMPLANT
YANKAUER SUCT BULB TIP NO VENT (SUCTIONS) ×2 IMPLANT

## 2015-05-28 NOTE — Consult Note (Signed)
PHARMACY CONSULT NOTE  Consult  :  Post Procedure Dosing of:  Vancomycin Indication :  Empiric Post-Procedure Antibiotic Prophylaxis   Pharmacy consulted for post-procedure dosing of Vancomycin  s/p Hemilaminotomy, decompression, Discectomy.    Patient is to receive one dose of Vancomycin post-op only.  Patient has NO drain.  Wt 95 kg,  CrCl > 120 ml/min.  PLAN:  1. Vancomycin 1 gm IV x 1 12 hours after end of surgery.  No dosing adjustment required. 2. Pharmacy will sign off. Please reconsult if additional assistance is needed.   Thank you for allowing Pharmacy to participate in this patient's care.   Makayia Duplessis, Colin Benton,  Pharm.D.,  05/28/2015,  6:58 PM

## 2015-05-28 NOTE — Transfer of Care (Signed)
Immediate Anesthesia Transfer of Care Note  Patient: Thomas York  Procedure(s) Performed: Procedure(s): L3-S1 HEMILAMINOTOMY, DECOMPRESSION, DISCECTOMY  (3  LEVELS) (N/A)  Patient Location: PACU  Anesthesia Type:General  Level of Consciousness: awake, oriented and patient cooperative  Airway & Oxygen Therapy: Patient Spontanous Breathing and Patient connected to nasal cannula oxygen  Post-op Assessment: Report given to RN and Post -op Vital signs reviewed and stable  Post vital signs: Reviewed  Last Vitals:  Filed Vitals:   05/28/15 0955  BP: 164/89  Pulse: 106  Temp: 36.7 C  Resp: 18    Complications: No apparent anesthesia complications
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
## 2015-05-28 NOTE — Anesthesia Preprocedure Evaluation (Addendum)
Anesthesia Evaluation  Patient identified by MRN, date of birth, ID band Patient awake    Reviewed: Allergy & Precautions, NPO status , Patient's Chart, lab work & pertinent test results  History of Anesthesia Complications Negative for: history of anesthetic complications  Airway Mallampati: II  TM Distance: >3 FB Neck ROM: Full    Dental  (+) Teeth Intact, Dental Advisory Given   Pulmonary neg pulmonary ROS,  breath sounds clear to auscultation        Cardiovascular hypertension, Pt. on medications Rhythm:Regular Rate:Normal     Neuro/Psych negative neurological ROS     GI/Hepatic Neg liver ROS, GERD-  Controlled,  Endo/Other  negative endocrine ROS  Renal/GU negative Renal ROS     Musculoskeletal Back pain    Abdominal   Peds  Hematology negative hematology ROS (+)   Anesthesia Other Findings   Reproductive/Obstetrics negative OB ROS                            Anesthesia Physical Anesthesia Plan  ASA: II  Anesthesia Plan: General   Post-op Pain Management:    Induction:   Airway Management Planned: Oral ETT  Additional Equipment:   Intra-op Plan:   Post-operative Plan: Extubation in OR  Informed Consent: I have reviewed the patients History and Physical, chart, labs and discussed the procedure including the risks, benefits and alternatives for the proposed anesthesia with the patient or authorized representative who has indicated his/her understanding and acceptance.   Dental advisory given  Plan Discussed with: CRNA and Surgeon  Anesthesia Plan Comments:        Anesthesia Quick Evaluation

## 2015-05-28 NOTE — Brief Op Note (Signed)
05/28/2015  4:03 PM  PATIENT:  Thomas York  34 y.o. male  PRE-OPERATIVE DIAGNOSIS:  SPINAL STENOSIS WITH HNP   POST-OPERATIVE DIAGNOSIS:  SPINAL STENOSIS WITH HNP   PROCEDURE:  Procedure(s): L3-S1 HEMILAMINOTOMY, DECOMPRESSION, DISCECTOMY  (3  LEVELS) (N/A)  SURGEON:  Surgeon(s) and Role:    * Venita Lick, MD - Primary  PHYSICIAN ASSISTANT:   ASSISTANTS: none   ANESTHESIA:   general  EBL:  Total I/O In: 2200 [I.V.:2200] Out: 650 [Urine:400; Blood:250]  BLOOD ADMINISTERED:none  DRAINS: none   LOCAL MEDICATIONS USED:  MARCAINE     SPECIMEN:  No Specimen  DISPOSITION OF SPECIMEN:  N/A  COUNTS:  YES  TOURNIQUET:  * No tourniquets in log *  DICTATION: .Other Dictation: Dictation Number [id number]  PLAN OF CARE: Admit to inpatient   PATIENT DISPOSITION:  PACU - hemodynamically stable.

## 2015-05-28 NOTE — Anesthesia Procedure Notes (Signed)
Procedure Name: Intubation Date/Time: 05/28/2015 11:50 AM Performed by: Lovie Chol Pre-anesthesia Checklist: Patient identified, Emergency Drugs available, Suction available, Patient being monitored and Timeout performed Patient Re-evaluated:Patient Re-evaluated prior to inductionOxygen Delivery Method: Circle system utilized Preoxygenation: Pre-oxygenation with 100% oxygen Intubation Type: IV induction Ventilation: Mask ventilation without difficulty and Oral airway inserted - appropriate to patient size Laryngoscope Size: Miller and 3 Grade View: Grade I Tube type: Oral Tube size: 7.5 mm Number of attempts: 1 Airway Equipment and Method: Stylet Placement Confirmation: ETT inserted through vocal cords under direct vision,  positive ETCO2,  CO2 detector and breath sounds checked- equal and bilateral Secured at: 22 cm Tube secured with: Tape Dental Injury: Teeth and Oropharynx as per pre-operative assessment

## 2015-05-29 MED ORDER — ONDANSETRON HCL 4 MG PO TABS: 4.0000 mg | ORAL_TABLET | Freq: Three times a day (TID) | ORAL | Status: DC | PRN

## 2015-05-29 MED ORDER — METHOCARBAMOL 500 MG PO TABS: 500.0000 mg | ORAL_TABLET | Freq: Three times a day (TID) | ORAL | Status: DC | PRN

## 2015-05-29 MED ORDER — OXYCODONE-ACETAMINOPHEN 10-325 MG PO TABS: 1.0000 | ORAL_TABLET | ORAL | Status: DC | PRN

## 2015-05-29 MED FILL — Thrombin For Soln 20000 Unit: CUTANEOUS | Qty: 1 | Status: AC

## 2015-05-29 NOTE — Evaluation (Addendum)
Occupational Therapy Evaluation Patient Details Name: Thomas York MRN: [id number] DOB: [birth year] Today's Date: 05/29/2015    History of Present Illness The patient is a 34 year old male who was admitted with spinal stenosis and HNP now s/p L3-S1 HEMILAMINOTOMY, DECOMPRESSION, DISCECTOMY (3 LEVELS)   Clinical Impression   Pt educated in back precautions related to ADL and IADL and use of adaptive techniques and equipment.  Performing ADL and mobility at a modified independent level.  Verbalized understanding of education.  No further OT needs.    Follow Up Recommendations  No OT follow up    Equipment Recommendations       Recommendations for Other Services       Precautions / Restrictions Precautions Precautions: Back Precaution Booklet Issued: Yes (comment) Precaution Comments: reviewed back precautions related to ADL and IADL Required Braces or Orthoses: Spinal Brace Spinal Brace: Lumbar corset;Applied in sitting position Restrictions Weight Bearing Restrictions: No      Mobility Bed Mobility Pt up in chair  Transfers Overall transfer level: Needs assistance   Transfers: Sit to/from Stand Sit to Stand: Modified independent           Balance Overall balance assessment: No apparent balance deficits (not formally assessed)                                          ADL Overall ADL's : Needs assistance/impaired Eating/Feeding: Independent;Sitting   Grooming: Wash/dry hands;Supervision/safety;Standing   Upper Body Bathing: Modified independent;Standing   Lower Body Bathing: Modified independent;Sit to/from stand   Upper Body Dressing : Independent;Sitting   Lower Body Dressing: Modified independent;Supervision/safety;Sitting/lateral leans;Sit to/from stand   Toilet Transfer: Independent   Toileting- Architect and Hygiene: Modified independent;Sitting/lateral lean   Tub/ Engineer, structural:  Supervision/safety;Ambulation;Tub transfer   Functional mobility during ADLs: Modified Independentn (slower pace) General ADL Comments: Educated pt in availability of reacher, long bath sponge and toilet tongs, safe footwear, and 2 cup method for toothbrushing.  Pt able to cross his foot over opposite knee to donn socks and shoes. Demonstrated independence in donning and doffing back brace. Instructed to change position frequently and avoid prolonged sitting.     Vision     Perception     Praxis      Pertinent Vitals/Pain Pain Assessment: Faces  Faces Pain Scale: Hurts a little bit Pain Location: incision Pain Descriptors / Indicators: Operative site guarding Pain Intervention(s): Premedicated before session;Repositioned;Monitored during session     Hand Dominance Right   Extremity/Trunk Assessment Upper Extremity Assessment Upper Extremity Assessment: Overall WFL for tasks assessed   Lower Extremity Assessment Lower Extremity Assessment: Overall WFL for tasks assessed       Communication Communication Communication: No difficulties   Cognition Arousal/Alertness: Awake/alert Behavior During Therapy: WFL for tasks assessed/performed Overall Cognitive Status: Within Functional Limits for tasks assessed                     General Comments       Exercises       Shoulder Instructions      Home Living Family/patient expects to be discharged to:: Private residence Living Arrangements: Parent Available Help at Discharge: Family (mom works nights) Type of Home: House Home Access: Stairs to enter Secretary/administrator of Steps: 1   Home Layout: One level     Bathroom Shower/Tub: Chief Strategy Officer: Standard  Home Equipment: Bedside commode   Additional Comments: Pt is aware of use of 3 in 1 over toilet to elevate.      Prior Functioning/Environment Level of Independence: Independent        Comments: worked in Musician Diagnosis:     OT Problem List:     OT Treatment/Interventions:      OT Goals(Current goals can be found in the care plan section) Acute Rehab OT Goals Patient Stated Goal: home today  OT Frequency:     Barriers to D/C:            Co-evaluation              End of Session    Activity Tolerance: Patient tolerated treatment well Patient left: in chair;with call bell/phone within reach   Time: 7530-0511 OT Time Calculation (min): 19 min Charges:  OT General Charges $OT Visit: 1 Procedure OT Evaluation $Initial OT Evaluation Tier I: 1 Procedure G-Codes:    Evern Bio 05/29/2015, 10:45 AM  [phone]

## 2015-05-29 NOTE — Evaluation (Signed)
Physical Therapy Evaluation Patient Details Name: Thomas York MRN: [id number] DOB: 1981-04-07 Today's Date: 05/29/2015   History of Present Illness  The patient is a 34 year old male who was admitted with spinal stenosis and HNP now s/p L3-S1 HEMILAMINOTOMY, DECOMPRESSION, DISCECTOMY (3 LEVELS)  Clinical Impression  Patient presents at independent level.  Education provided regarding activity level, mobility and ADL's with precautions and possible need for outpatient PT if decided by patient and MD prior to return to work.  No further skilled PT needs at this time.   Follow Up Recommendations No PT follow up    Equipment Recommendations  None recommended by PT    Recommendations for Other Services       Precautions / Restrictions Precautions Precautions: Back Precaution Comments: already had handout in room Required Braces or Orthoses: Spinal Brace Spinal Brace: Lumbar corset;Applied in sitting position      Mobility  Bed Mobility Overal bed mobility: Modified Independent             General bed mobility comments: performed with appropriate back preacuations for supine to side to sit  Transfers Overall transfer level: Needs assistance   Transfers: Sit to/from Stand Sit to Stand: Supervision         General transfer comment: reviewed technique with precautions  Ambulation/Gait Ambulation/Gait assistance: Independent Ambulation Distance (Feet): 400 Feet Assistive device: None Gait Pattern/deviations: WFL(Within Functional Limits) Gait velocity: slower      Stairs Stairs: Yes Stairs assistance: Min guard   Number of Stairs: 1 General stair comments: w/hand hold assist  Wheelchair Mobility    Modified Rankin (Stroke Patients Only)       Balance Overall balance assessment: No apparent balance deficits (not formally assessed)                                           Pertinent Vitals/Pain Pain Assessment: 0-10 Pain Score:  6  Pain Location: back Pain Descriptors / Indicators: Sore Pain Intervention(s): Monitored during session;Repositioned    Home Living Family/patient expects to be discharged to:: Private residence Living Arrangements: Parent Available Help at Discharge: Family Type of Home: House Home Access: Stairs to enter   Secretary/administrator of Steps: 1 Home Layout: One level Home Equipment: None      Prior Function Level of Independence: Independent         Comments: worked in Teacher, early years/pre        Extremity/Trunk Assessment               Lower Extremity Assessment: Overall WFL for tasks assessed         Communication   Communication: No difficulties  Cognition Arousal/Alertness: Awake/alert Behavior During Therapy: WFL for tasks assessed/performed Overall Cognitive Status: Within Functional Limits for tasks assessed                      General Comments      Exercises        Assessment/Plan    PT Assessment Patent does not need any further PT services  PT Diagnosis Acute pain   PT Problem List    PT Treatment Interventions     PT Goals (Current goals can be found in the Care Plan section) Acute Rehab PT Goals PT Goal Formulation: All assessment and education complete, DC therapy    Frequency  Barriers to discharge        Co-evaluation               End of Session Equipment Utilized During Treatment: Back brace Activity Tolerance: Patient tolerated treatment well Patient left: in chair;with call bell/phone within reach           Time: 0900-0925 PT Time Calculation (min) (ACUTE ONLY): 25 min   Charges:   PT Evaluation $Initial PT Evaluation Tier I: 1 Procedure PT Treatments $Self Care/Home Management: 8-22   PT G Codes:        Thomas York,Thomas York 06-16-2015, 10:01 AM  Sheran Lawless, PT [phone] June 16, 2015

## 2015-05-29 NOTE — Care Management (Signed)
Utilization review completed by Angeleah Labrake N. Ramere Downs, RN BSN 

## 2015-05-29 NOTE — Progress Notes (Signed)
    Subjective: Procedure(s) (LRB): L3-S1 HEMILAMINOTOMY, DECOMPRESSION, DISCECTOMY  (3  LEVELS) (N/A) 1 Day Post-Op  Patient reports pain as 2 on 0-10 scale.  Reports decreased leg pain reports incisional back pain   Positive void Negative bowel movement Positive flatus Negative chest pain or shortness of breath  Objective: Vital signs in last 24 hours: Temp:  [97.4 F (36.3 C)-98.4 F (36.9 C)] 97.8 F (36.6 C) (06/16 1232) Pulse Rate:  [82-112] 87 (06/16 1232) Resp:  [14-28] 18 (06/16 1232) BP: (138-167)/(61-81) 162/81 mmHg (06/16 1232) SpO2:  [96 %-100 %] 97 % (06/16 0850)  Intake/Output from previous day: 06/15 0701 - 06/16 0700 In: 2400 [I.V.:2400] Out: 1000 [Urine:750; Blood:250]  Labs: No results for input(s): WBC, RBC, HCT, PLT in the last 72 hours. No results for input(s): NA, K, CL, CO2, BUN, CREATININE, GLUCOSE, CALCIUM in the last 72 hours. No results for input(s): LABPT, INR in the last 72 hours.  Physical Exam: Neurologically intact ABD soft Intact pulses distally Incision: dressing C/D/I and no drainage Compartment soft  Assessment/Plan: Patient stable  xrays n/a Continue mobilization with physical therapy Continue care  Advance diet Up with therapy  Doing well Radicular leg pain resolved Ok for d/c to home  Venita Lick, MD [facility] [phone]

## 2015-05-29 NOTE — Progress Notes (Signed)
Patient alert and oriented, mae's well, voiding adequate amount of urine, swallowing without difficulty, no c/o pain. Patient discharged home with family. Script and discharged instructions given to patient. Patient and family stated understanding of d/c instructions given and has an appointment with MD. 

## 2015-05-29 NOTE — Anesthesia Postprocedure Evaluation (Signed)
  Anesthesia Post-op Note  Patient: Thomas York  Procedure(s) Performed: Procedure(s): L3-S1 HEMILAMINOTOMY, DECOMPRESSION, DISCECTOMY  (3  LEVELS) (N/A)  Patient Location: PACU  Anesthesia Type:General  Level of Consciousness: awake, alert  and oriented  Airway and Oxygen Therapy: Patient Spontanous Breathing and Patient connected to nasal cannula oxygen  Post-op Pain: mild  Post-op Assessment: Post-op Vital signs reviewed, Patient's Cardiovascular Status Stable, Respiratory Function Stable, Patent Airway and Pain level controlled LLE Motor Response: Purposeful movement LLE Sensation: Full sensation RLE Motor Response: Purposeful movement RLE Sensation: Full sensation      Post-op Vital Signs: stable  Last Vitals:  Filed Vitals:   05/29/15 0850  BP: 148/67  Pulse: 82  Temp: 36.9 C  Resp: 18    Complications: No apparent anesthesia complications

## 2015-05-29 NOTE — Op Note (Signed)
Thomas York, Thomas York NO.:  [id number]  MEDICAL RECORD NO.:  [id number]  LOCATION:  3C09C                        FACILITY:  [facility]  PHYSICIAN:  Dantae Meunier D. Shon Baton, M.D. DATE OF BIRTH:  08-27-1981  DATE OF PROCEDURE:  05/28/2015 DATE OF DISCHARGE:                              OPERATIVE REPORT   PREOPERATIVE DIAGNOSIS:  Three-level right-sided foraminal and lateral recess stenosis at L3-4, L4-5, L5-S1 with right radicular leg pain.  POSTOPERATIVE DIAGNOSIS:  Three-level right-sided foraminal and lateral recess stenosis at L3-4, L4-5, L5-S1 with right radicular leg pain.  OPERATIVE PROCEDURE:  Laminotomy L3, L4, and L5 with diskectomy 3-4 and 5-1, and lateral recess and foraminal decompression 3-4, 4-5, 5-1.  COMPLICATIONS:  None.  CONDITION:  Stable.  HISTORY:  This is a very pleasant 34 year old gentleman, who about 6 months ago started having severe back, buttock, and radicular right leg pain.  Imaging studies demonstrated the three-level disk pathology of spinal stenosis.  After failing nonoperative treatment including injection therapy, we discussed surgery.  He elected not to proceed with this.  However, his symptoms progressed to the point where he could not tolerate and he was also noted some erectile dysfunction.  As a result, he elected to proceed with surgery.  All appropriate risks, benefits, and alternatives were discussed with the patient, and consent was obtained.  OPERATIVE NOTE:  The patient was brought to the operating room, placed supine on the operating room table.  After successful induction of general anesthesia and endotracheal intubation, TEDs, SCDs, and Foley were inserted, he was turned prone onto the Wilson frame and all bony prominences were well padded.  The back was prepped and draped in a standard fashion.  Time-out was taken to confirm the patient, procedure, and all other pertinent important data.  Once this was completed,  the back was prepped and draped in a standard fashion and needles were placed into the back to mark the incision site.  Lateral x-ray confirmed the levels of the L3 and the L5 spinous process.  Midline incision was then infiltrated with 0.25% Marcaine and then incised.  Sharp dissection was carried out down to the deep fascia.  Deep fascia sharply incised and I stripped the paraspinal muscles and exposed the right side of the spine.  Spinous processes of L3, L4, and L5, as well as the lamina and facet capsules were all exposed.  I then placed a Penfield 4 underneath the ligamentum flavum of the lamina at L5 and took a second x-ray.  I confirmed that I was at the L5 lamina.  Once this was done, using a 2 and 3 mm Kerrison, I performed a very generous laminotomy at L5.  This was where the maximum amount of compression was on the right side.  I then released the ligamentum flavum and completely decompressed the central and lateral recess of L5 on that right side.  I even went superiorly undercutting the superior portion of the L5 spinous process. I was able to take my [facility] and passed it over on to the left hand side and there was no further central stenosis.  I then removed some of the superior aspect of the  S1 lamina to adequately decompress the area.  I then identified the S1 nerve root and protected that. There was significant lateral recess stenosis with hard disk osteophyte formation.  Once I developed a plane between the bone and the nerve, I used my 1 and 2 mm Kerrison to resect the osteophyte.  I could then palpate the pedicle of S1.  Once this was done, I then identified the large posterolateral disk herniation.  At this point, it was more of a calcified mass.  This was not a soft disk herniation.  Using Epstein curette, I attempted to incise it, but it was almost calcified and so I used the Epstein curettes to break it apart and removed the fragments with micropituitary  rongeurs.  Once I began this decompression, I noticed that the S1 nerve root was becoming freer and no longer under tension.  I removed as much as the osteophyte bone spur that I could.  I then noticed as I was retracting it that it did start the undersurface of the thecal sac and I became concerned about potential ventral CSF leak.  As a result, I stopped and then irrigated the wound copiously with normal saline.  I took my Public house manager, passed it superiorly in the lateral recess and I could pass it centrally and down until the recess along the S1 nerve root and into the foramen.  The nerve was adequately decompressed.  I then repositioned my retractor and then performed a similar hemi-laminotomy of L4.  I used the same technique. Again, the lateral recess, there was more lateral recess stenosis and central.  Again, this was decompressed with 1 mm Kerrison punch using pituitary rongeurs and Penfield 4 for retraction.  Once I had completed the 4-5 decompression, I could usually sweep down along the L5 nerve root towards the L5 pedicle, out the L4 foramen and superiorly, I then repositioned my retractor at the L3 level.  Again using the same technique, I performed a generous L3 hemilaminotomy and lateral recess decompression.  This again was achieved in the similar fashion as previously noted.  Again at this level, there was more of an osteophyte disk herniation in the posterolateral corner.  There was also a sequestered fragment that was behind the body of L4 just below the disk space level.  Once I had the thecal sac and the nerve root exposed, I noticed that there was a significant lateral recess osteophyte from the facet and this was resected using a 1 mm Kerrison.  I was able to sweep underneath the thecal sac and deliver the sequestered osteophytic bone spur.  I then worked my way into the disk space and removed any loose fragments of disk material.  Again using Epstein curettes,  I attempted to debride some of the hard osteophytic bone spur.  This was done until I felt as though there was danger of a thecal sac tear.  At this point, I irrigated the wound copiously with normal saline, and then using bipolar electrocautery and FloSeal, I maintained hemostasis.  Once I had hemostasis and I confirmed that I had an adequate decompression at 3-1, I irrigated the wound copiously with normal saline, and then placed the thrombin-soaked Gelfoam patty over the exposed hemilaminotomy sites.  I then closed the deep fascia with interrupted #1 Vicryl suture, superficial with 2-0 Vicryl suture, and a 3-0 Monocryl for the skin. Steri-Strips and dry dressing were applied.  The patient was ultimately extubated, transferred to PACU without incident.  At the  end of the case, all needle and sponge counts were correct.  There were no adverse intraoperative events.     Sherial Ebrahim D. Shon Baton, M.D.     DDB/MEDQ  D:  05/28/2015  T:  05/29/2015  Job:  [id number]

## 2015-05-30 ENCOUNTER — Encounter (HOSPITAL_COMMUNITY): Payer: Self-pay | Admitting: Orthopedic Surgery

## 2015-06-03 NOTE — Discharge Summary (Signed)
Patient ID: KEONTAE EBSEN MRN: [id number] DOB/AGE: [birth year] 34 y.o.  Admit date: 05/28/2015 Discharge date: 06/03/2015  Admission Diagnoses:  Active Problems:   Back pain   Discharge Diagnoses:  Active Problems:   Back pain  status post Procedure(s): L3-S1 HEMILAMINOTOMY, DECOMPRESSION, DISCECTOMY  (3  LEVELS)  Past Medical History  Diagnosis Date  . Hyperlipemia     takes Lipitor and Fenofibrate daily  . Hypertension     takes Lisinopril daily  . Weakness     numbness and tingling down right leg  . Chronic back pain     HNP   . GERD (gastroesophageal reflux disease)     has taken Zantac daily  . History of kidney stones   . Anxiety     takes Valium daily as needed  . Insomnia     coming from being in pain    Surgeries: Procedure(s): L3-S1 HEMILAMINOTOMY, DECOMPRESSION, DISCECTOMY  (3  LEVELS) on 05/28/2015   Consultants:    Discharged Condition: Improved  Hospital Course: CHADWYCK SCHUCHMANN is an 34 y.o. male who was admitted 05/28/2015 for operative treatment of <principal problem not specified>. Patient failed conservative treatments (please see the history and physical for the specifics) and had severe unremitting pain that affects sleep, daily activities and work/hobbies. After pre-op clearance, the patient was taken to the operating room on 05/28/2015 and underwent  Procedure(s): L3-S1 HEMILAMINOTOMY, DECOMPRESSION, DISCECTOMY  (3  LEVELS).    Patient was given perioperative antibiotics:  Anti-infectives    Start     Dose/Rate Route Frequency Ordered Stop   05/29/15 0400  vancomycin (VANCOCIN) IVPB 1000 mg/200 mL premix     1,000 mg 200 mL/hr over 60 Minutes Intravenous  Once 05/28/15 1856 05/29/15 0448   05/28/15 0953  vancomycin (VANCOCIN) IVPB 1000 mg/200 mL premix     1,000 mg 200 mL/hr over 60 Minutes Intravenous 60 min pre-op 05/28/15 0953 05/28/15 1235       Patient was given sequential compression devices and early ambulation to prevent DVT.    Patient benefited maximally from hospital stay and there were no complications. At the time of discharge, the patient was urinating/moving their bowels without difficulty, tolerating a regular diet, pain is controlled with oral pain medications and they have been cleared by PT/OT.   Recent vital signs: No data found.    Recent laboratory studies: No results for input(s): WBC, HGB, HCT, PLT, NA, K, CL, CO2, BUN, CREATININE, GLUCOSE, INR, CALCIUM in the last 72 hours.  Invalid input(s): PT, 2   Discharge Medications:     Medication List    STOP taking these medications        cyclobenzaprine 10 MG tablet  Commonly known as:  FLEXERIL     diazepam 5 MG tablet  Commonly known as:  VALIUM     HYDROcodone-acetaminophen 5-325 MG per tablet  Commonly known as:  NORCO     ibuprofen 200 MG tablet  Commonly known as:  ADVIL,MOTRIN     naproxen 500 MG tablet  Commonly known as:  NAPROSYN      TAKE these medications        atorvastatin 20 MG tablet  Commonly known as:  LIPITOR  Take 20 mg by mouth daily.     fenofibrate micronized 130 MG capsule  Commonly known as:  ANTARA  Take 130 mg by mouth daily.     lisinopril 10 MG tablet  Commonly known as:  PRINIVIL,ZESTRIL  Take 5 mg by  mouth daily.     methocarbamol 500 MG tablet  Commonly known as:  ROBAXIN  Take 1 tablet (500 mg total) by mouth 3 (three) times daily as needed for muscle spasms.     ondansetron 4 MG tablet  Commonly known as:  ZOFRAN  Take 1 tablet (4 mg total) by mouth every 8 (eight) hours as needed for nausea or vomiting.     oxyCODONE-acetaminophen 10-325 MG per tablet  Commonly known as:  PERCOCET  Take 1 tablet by mouth every 4 (four) hours as needed for pain.     ranitidine 150 MG tablet  Commonly known as:  ZANTAC  Take 1 tablet (150 mg total) by mouth 2 (two) times daily.        Diagnostic Studies: Dg Lumbar Spine 2-3 Views  05/28/2015   CLINICAL DATA:  L3-S1 decompression with cystectomy.   EXAM: LUMBAR SPINE - 2-3 VIEW  COMPARISON:  MRI scan of October 13, 2011.  FINDINGS: Two intraoperative cross-table lateral projections of the lumbar spine were obtained. The initial image demonstrates 1 surgical probe directed toward posterior margin of L3-4 disc space, with second probe overlying the posterior spinous process of S1. The second image demonstrates surgical probe directed toward the posterior portion of the L5-S1 disc space.  IMPRESSION: Surgical localization as described above.   Electronically Signed   By: Lupita Raider, M.D.   On: 05/28/2015 14:57   Dg Foot Complete Left  05/15/2015   CLINICAL DATA:  Painful lump on dorsum of foot today, no injury.  EXAM: LEFT FOOT - COMPLETE 3+ VIEW  COMPARISON:  LEFT foot radiograph December 11, 2013  FINDINGS: No acute fracture deformity or dislocation. Joint space intact without erosions. No destructive bony lesions. Soft tissue planes are not suspicious.  IMPRESSION: Negative.   Electronically Signed   By: Awilda Metro M.D.   On: 05/15/2015 00:16          Follow-up Information    Follow up with Alvy Beal, MD.   Specialty:  Orthopedic Surgery   Contact information:   [address] [place] [address] [phone]       Discharge Plan:  discharge to home  Disposition: doing well.  Tolerating regular diet, neuro intact.  Radicular leg pain improved.  Hospital course uneventful.    Signed: Venita Lick D for Dr. Venita Lick [facility] [phone] 06/03/2015, 4:48 PM

## 2015-07-21 ENCOUNTER — Ambulatory Visit: Payer: BLUE CROSS/BLUE SHIELD | Admitting: Physical Therapy

## 2015-07-23 ENCOUNTER — Ambulatory Visit: Payer: BLUE CROSS/BLUE SHIELD | Attending: Orthopedic Surgery | Admitting: Physical Therapy

## 2015-07-23 DIAGNOSIS — M2560 Stiffness of unspecified joint, not elsewhere classified: Secondary | ICD-10-CM | POA: Insufficient documentation

## 2015-07-23 DIAGNOSIS — M5386 Other specified dorsopathies, lumbar region: Secondary | ICD-10-CM

## 2015-07-23 DIAGNOSIS — M545 Low back pain: Secondary | ICD-10-CM

## 2015-07-23 NOTE — Therapy (Signed)
[facility] [place], [place], [address] Phone: [phone]   Fax:  [phone]  Physical Therapy Evaluation  Patient Details  Name: Thomas York MRN: [id number] Date of Birth: [birth year] Referring Provider:  Venita Lick, MD  Encounter Date: 07/23/2015      PT End of Session - 07/23/15 1012    Visit Number 1   Number of Visits 12   Date for PT Re-Evaluation 09/10/15   PT Start Time 0947   PT Stop Time 1012   PT Time Calculation (min) 25 min   Activity Tolerance Patient tolerated treatment well   Behavior During Therapy [facility] for tasks assessed/performed      Past Medical History  Diagnosis Date  . Hyperlipemia     takes Lipitor and Fenofibrate daily  . Hypertension     takes Lisinopril daily  . Weakness     numbness and tingling down right leg  . Chronic back pain     HNP   . GERD (gastroesophageal reflux disease)     has taken Zantac daily  . History of kidney stones   . Anxiety     takes Valium daily as needed  . Insomnia     coming from being in pain    Past Surgical History  Procedure Laterality Date  . Hernia repair Right     inguinal   . Lumbar laminectomy/decompression microdiscectomy N/A 05/28/2015    Procedure: L3-S1 HEMILAMINOTOMY, DECOMPRESSION, DISCECTOMY  (3  LEVELS);  Surgeon: Venita Lick, MD;  Location: [facility];  Service: Orthopedics;  Laterality: N/A;    There were no vitals filed for this visit.  Visit Diagnosis:  Right low back pain, with sciatica presence unspecified - Plan: PT plan of care cert/re-cert  Decreased range of motion of lumbar spine - Plan: PT plan of care cert/re-cert      Subjective Assessment - 07/23/15 0955    Subjective CC leg symptoms but not as bad as it use to be.  Walking a mile and half morning and evening.   Limitations Sitting   How long can you sit comfortably? 20 minutes.   Patient Stated Goals Get out of pain and back to work.   Currently in Pain?  Yes   Pain Score 3    Pain Location Leg   Pain Orientation Right   Pain Descriptors / Indicators Tingling   Pain Type Chronic pain   Pain Onset More than a month ago   Pain Frequency Intermittent   Aggravating Factors  Prolonged sitting.   Pain Relieving Factors Resting and walking.            Thomas York PT Assessment - 07/23/15 0001    Assessment   Medical Diagnosis Lumbar microdiscectomy.   Onset Date/Surgical Date --  05/28/15.   Precautions   Precautions --  Follow protocol.   Restrictions   Weight Bearing Restrictions No   Balance Screen   Has the patient fallen in the past 6 months No   Has the patient had a decrease in activity level because of a fear of falling?  No   Is the patient reluctant to leave their home because of a fear of falling?  No   Home Tourist information centre manager residence   Prior Function   Level of Independence Independent   Cognition   Overall Cognitive Status Within Functional Limits for tasks assessed   Posture/Postural Control   Posture Comments Minimal loss of lumbar lordosis.  ROM / Strength   AROM / PROM / Strength AROM;Strength   AROM   AROM Assessment Site Hip;Lumbar   Right/Left Hip Right;Left   Right Hip Flexion --  105 degrees.   Left Hip Flexion --  115 degrees.   Lumbar Extension 10 degrees.   Strength   Overall Strength Comments Normal LE strength.   Palpation   Palpation comment No significant palpable tenderness around the patient's lumbar incisional site.   Special Tests    Special Tests --  Equal leg lengths.  Absent right Achilles reflex.   Ambulation/Gait   Gait Comments Normal gait cycle.                                PT Long Term Goals - 07/23/15 1017    PT LONG TERM GOAL #1   Title Ind with advanced HEP.   Time 4   Period Weeks   Status New   PT LONG TERM GOAL #2   Title Right hip flexion= 115 degrees.   Time 4   Period Weeks   Status New   PT LONG TERM GOAL #3    Title Perform ADL's with pain not > 2/10.   Time 4   Period Weeks   Status New   PT LONG TERM GOAL #4   Title Eliminate right LE symptoms.   Time 4   Period Weeks   Status New   PT LONG TERM GOAL #5   Title Return to work.   Time 4   Period Weeks   Status New               Plan - 07/23/15 1012    Clinical Impression Statement The patient reports a h/o low back pain dating back to 2010.  He reports that the pain worsened over the years and began to run into his right buttock and eventually down the length of his right LE that resulted in intense burning on his right foot rated at a 9/10.  He underwent a three level microdiscectomy (L3 to S1) on 05/28/15.  His pain is staying around a 3/10 now and he still exeriences right foot symtoms described as "tingling" but nothing like it was prior to surgery.  He is walking one and a half miles morning and evening at this point.     Pt will benefit from skilled therapeutic intervention in order to improve on the following deficits Pain;Decreased activity tolerance   Rehab Potential Excellent   PT Frequency 3x / week   PT Duration 4 weeks   PT Treatment/Interventions ADLs/Self Care Home Management;Cryotherapy;Electrical Stimulation;Moist Heat;Ultrasound;Therapeutic activities;Therapeutic exercise;Manual techniques   PT Next Visit Plan Follow microdiscectomy protocol.  Right SKTC.  Elliptical and begin low-level core exercises.  Modalites PRN.   Consulted and Agree with Plan of Care Patient         Problem List Patient Active Problem List   Diagnosis Date Noted  . Back pain 05/28/2015    Thomas York, Thomas York MPT 07/23/2015, 10:21 AM  [facility] [address] [place], [place], [address] Phone: [phone]   Fax:  [phone]

## 2015-07-24 ENCOUNTER — Ambulatory Visit: Payer: BLUE CROSS/BLUE SHIELD | Admitting: Physical Therapy

## 2015-07-24 DIAGNOSIS — M545 Low back pain: Secondary | ICD-10-CM | POA: Diagnosis not present

## 2015-07-24 DIAGNOSIS — M5386 Other specified dorsopathies, lumbar region: Secondary | ICD-10-CM

## 2015-07-24 NOTE — Patient Instructions (Signed)
HOLD ALL STRETCHES 30 SECONDS OR LONGER. DO 3 REPS EACH SIDE. 2 TIMES PER DAY.  Hamstring Stretch, Reclined (Strap, Doorframe)   Lengthen bottom leg on floor. Extend top leg along edge of doorframe or press foot up into yoga strap. Hold for 30____ breaths. Repeat 3____ times each leg.   Outer Hip Stretch: Reclined IT Band Stretch (Strap)   Strap around opposite foot, pull across only as far as possible with shoulders on mat. Hold for __30__ secondss. Repeat __3__ times each leg.  Copyright  VHI. All rights reserved.   Raynelle Fanning Chigozie Basaldua, PT Piriformis (Supine)  Cross legs, right on top. Gently pull other knee toward chest until stretch is felt in buttock/hip of top leg. Hold ____ seconds. Repeat ____ times per set. Do ____ sets per session. Do ____ sessions per day.   Quadriceps (Prone)   On stomach with sheet around ankles, knees together, hips down, pull heels toward bottom. Keep hips flat. Hold _30___ seconds. Repeat ____ times. Do ____ sessions per day. CAUTION: Stretch should be gentle, steady and slow.  Copyright  VHI. All rights reserved.   Achilles Tendon Stretch   Stand with hands supported on wall, elbows slightly bent, feet parallel and both heels on floor, front knee bent, back knee straight. Slowly relax back knee until a stretch is felt in achilles tendon. Hold __30_ seconds. Repeat with leg positions switched. Repeat with back knee slightly bent.  Copyright  VHI. All rights reserved.   Solon Palm, PT 07/24/2015 2:09 PM [facility] [address] [place], [place], [address] Phone: [phone]   Fax:  [phone]

## 2015-07-24 NOTE — Therapy (Signed)
[facility] [address] [place], [place], [address] Phone: [phone]   Fax:  [phone]  Physical Therapy Treatment  Patient Details  Name: Thomas York MRN: [id number] Date of Birth: [birth year] Referring Provider:  Richmond Campbell., PA-C  Encounter Date: 07/24/2015      PT End of Session - 07/24/15 1348    Visit Number 2   Number of Visits 12   Date for PT Re-Evaluation 09/10/15   PT Start Time 1348   PT Stop Time 1429   PT Time Calculation (min) 41 min   Activity Tolerance Patient tolerated treatment well      Past Medical History  Diagnosis Date  . Hyperlipemia     takes Lipitor and Fenofibrate daily  . Hypertension     takes Lisinopril daily  . Weakness     numbness and tingling down right leg  . Chronic back pain     HNP   . GERD (gastroesophageal reflux disease)     has taken Zantac daily  . History of kidney stones   . Anxiety     takes Valium daily as needed  . Insomnia     coming from being in pain    Past Surgical History  Procedure Laterality Date  . Hernia repair Right     inguinal   . Lumbar laminectomy/decompression microdiscectomy N/A 05/28/2015    Procedure: L3-S1 HEMILAMINOTOMY, DECOMPRESSION, DISCECTOMY  (3  LEVELS);  Surgeon: Venita Lick, MD;  Location: [facility] OR;  Service: Orthopedics;  Laterality: N/A;    There were no vitals filed for this visit.  Visit Diagnosis:  Decreased range of motion of lumbar spine  Right low back pain, with sciatica presence unspecified      Subjective Assessment - 07/24/15 1349    Subjective No new complaints. He states no pain today.   Currently in Pain? No/denies                         [facility] Adult PT Treatment/Exercise - 07/24/15 0001    Exercises   Exercises Lumbar;Knee/Hip   Lumbar Exercises: Standing   Heel Raises 20 reps  2 direction toes straight and out   Lumbar Exercises: Supine   Ab Set 10 reps;5 seconds  TCs and VCs  for correct TrAb engagement   Bridge 10 reps;5 seconds   Lumbar Exercises: Prone   Straight Leg Raise 10 reps;3 seconds  bil   Other Prone Lumbar Exercises attempted MF set but couldnt engage without gluts   Knee/Hip Exercises: Stretches   Active Hamstring Stretch 2 reps;30 seconds  with strap   Quad Stretch Right;2 reps;30 seconds  prone with strap   ITB Stretch Left;2 reps;30 seconds  with strap   Piriformis Stretch Right;2 reps;30 seconds  figure 4   Gastroc Stretch Right;2 reps;30 seconds   Knee/Hip Exercises: Aerobic   Elliptical Ramp 3  x 5 min   Knee/Hip Exercises: Prone   Hip Extension --   Other Prone Exercises --                PT Education - 07/24/15 1604    Education provided Yes   Education Details HEP: gastroc, piriformis, quad, hs, and itb stretches.   Person(s) Educated Patient   Methods Explanation;Demonstration;Handout   Comprehension Verbalized understanding;Returned demonstration             PT Long Term Goals - 07/23/15 1017    PT LONG TERM GOAL #1  Title Ind with advanced HEP.   Time 4   Period Weeks   Status New   PT LONG TERM GOAL #2   Title Right hip flexion= 115 degrees.   Time 4   Period Weeks   Status New   PT LONG TERM GOAL #3   Title Perform ADL's with pain not > 2/10.   Time 4   Period Weeks   Status New   PT LONG TERM GOAL #4   Title Eliminate right LE symptoms.   Time 4   Period Weeks   Status New   PT LONG TERM GOAL #5   Title Return to work.   Time 4   Period Weeks   Status New               Plan - 07/24/15 1605    Clinical Impression Statement Patient tolerated all exercises well today. He has difficulty engaging bil multifidus without using gluts. Demos right gastroc atrophy and tightness in BLE.   PT Next Visit Plan Follow microdiscectomy protocol.  Right SKTC. Contnue elliptical and low level core exercises.        Problem List Patient Active Problem List   Diagnosis Date Noted  . Back  pain 05/28/2015    Solon Palm PT  07/24/2015, 4:08 PM  [facility] [address] [place], [place], [address] Phone: [phone]   Fax:  [phone]

## 2015-07-29 ENCOUNTER — Ambulatory Visit: Payer: BLUE CROSS/BLUE SHIELD | Admitting: *Deleted

## 2015-07-29 ENCOUNTER — Encounter: Payer: Self-pay | Admitting: *Deleted

## 2015-07-29 DIAGNOSIS — M545 Low back pain: Secondary | ICD-10-CM

## 2015-07-29 DIAGNOSIS — M5386 Other specified dorsopathies, lumbar region: Secondary | ICD-10-CM

## 2015-07-29 NOTE — Therapy (Signed)
[facility] [address] [place], [place], [address] Phone: [phone]   Fax:  [phone]  Physical Therapy Treatment  Patient Details  Name: Thomas York MRN: [id number] Date of Birth: 02-Dec-1981 Referring Provider:  Richmond Campbell., PA-C  Encounter Date: 07/29/2015      PT End of Session - 07/29/15 1046    Visit Number 3   Number of Visits 12   PT Start Time 1030   PT Stop Time 1119   PT Time Calculation (min) 49 min      Past Medical History  Diagnosis Date  . Hyperlipemia     takes Lipitor and Fenofibrate daily  . Hypertension     takes Lisinopril daily  . Weakness     numbness and tingling down right leg  . Chronic back pain     HNP   . GERD (gastroesophageal reflux disease)     has taken Zantac daily  . History of kidney stones   . Anxiety     takes Valium daily as needed  . Insomnia     coming from being in pain    Past Surgical History  Procedure Laterality Date  . Hernia repair Right     inguinal   . Lumbar laminectomy/decompression microdiscectomy N/A 05/28/2015    Procedure: L3-S1 HEMILAMINOTOMY, DECOMPRESSION, DISCECTOMY  (3  LEVELS);  Surgeon: Venita Lick, MD;  Location: [facility] OR;  Service: Orthopedics;  Laterality: N/A;    There were no vitals filed for this visit.  Visit Diagnosis:  Right low back pain, with sciatica presence unspecified  Decreased range of motion of lumbar spine      Subjective Assessment - 07/29/15 1041    Subjective No new complaints. He states no pain today. RT Great toe still numb   Limitations Sitting   How long can you sit comfortably? 20 minutes.   Patient Stated Goals Get out of pain and back to work.   Currently in Pain? Yes   Pain Score 2    Pain Location Back   Pain Orientation Right   Pain Descriptors / Indicators Tingling;Numbness  RT Great toe stays numb   Pain Type Chronic pain   Pain Onset More than a month ago   Pain Frequency Intermittent   Aggravating Factors  prolonged sitting   Effect of Pain on Daily Activities resting and walking                         [facility] Adult PT Treatment/Exercise - 07/29/15 0001    Exercises   Exercises Lumbar;Knee/Hip   Lumbar Exercises: Stretches   Active Hamstring Stretch 5 reps;30 seconds  Both LEs with Ankle pumps for nerve glide (gentle on RT)   Piriformis Stretch 3 reps;30 seconds  Both LEs   Lumbar Exercises: Supine   Ab Set 10 reps;5 seconds  TCs and VCs for correct TrAb engagement   Bent Knee Raise 10 reps;5 seconds   Bridge 10 reps;5 seconds   Knee/Hip Exercises: Aerobic   Elliptical Ramp 3 , L5 x 5 min   Knee/Hip Exercises: Seated   Sit to Sand 1 set;10 reps  Functional movement pattern with drawin                PT Education - 07/29/15 1053    Education provided Yes   Education Details Draw in TrAb activation   Person(s) Educated Patient   Methods Explanation;Demonstration;Tactile cues;Verbal cues;Handout   Comprehension Returned demonstration;Verbalized understanding  PT Long Term Goals - 07/23/15 1017    PT LONG TERM GOAL #1   Title Ind with advanced HEP.   Time 4   Period Weeks   Status New   PT LONG TERM GOAL #2   Title Right hip flexion= 115 degrees.   Time 4   Period Weeks   Status New   PT LONG TERM GOAL #3   Title Perform ADL's with pain not > 2/10.   Time 4   Period Weeks   Status New   PT LONG TERM GOAL #4   Title Eliminate right LE symptoms.   Time 4   Period Weeks   Status New   PT LONG TERM GOAL #5   Title Return to work.   Time 4   Period Weeks   Status New               Plan - 07/29/15 1526    Clinical Impression Statement Pt did well with stretches and core activation exs.He feels that he is getting better at activating the TrAb and has better posture awareness. He is still very guarded with transitional movements and needs VCs for hinge bendingto perform sit to stand.   Pt will  benefit from skilled therapeutic intervention in order to improve on the following deficits Pain;Decreased activity tolerance   PT Duration 4 weeks   PT Treatment/Interventions ADLs/Self Care Home Management;Cryotherapy;Electrical Stimulation;Moist Heat;Ultrasound;Therapeutic activities;Therapeutic exercise;Manual techniques   PT Next Visit Plan Follow microdiscectomy protocol.  Right SKTC. Contnue elliptical and low level core exercises. Practice sit to stand   Consulted and Agree with Plan of Care Patient        Problem List Patient Active Problem List   Diagnosis Date Noted  . Back pain 05/28/2015    RAMSEUR,CHRIS, PTA 07/29/2015, 3:36 PM  [facility] [address] [place], [place], [address] Phone: [phone]   Fax:  [phone]

## 2015-07-29 NOTE — Patient Instructions (Signed)

## 2015-07-31 ENCOUNTER — Ambulatory Visit: Payer: BLUE CROSS/BLUE SHIELD | Admitting: *Deleted

## 2015-07-31 ENCOUNTER — Encounter: Payer: Self-pay | Admitting: *Deleted

## 2015-07-31 DIAGNOSIS — M545 Low back pain: Secondary | ICD-10-CM | POA: Diagnosis not present

## 2015-07-31 DIAGNOSIS — M5386 Other specified dorsopathies, lumbar region: Secondary | ICD-10-CM

## 2015-07-31 NOTE — Therapy (Signed)
[facility] [address] [place], [place], [address] Phone: [phone]   Fax:  [phone]  Physical Therapy Treatment  Patient Details  Name: Thomas York MRN: [id number] Date of Birth: [birth year] Referring Provider:  Richmond Campbell., PA-C  Encounter Date: 07/31/2015      PT End of Session - 07/31/15 1102    Visit Number 4   Number of Visits 12   Date for PT Re-Evaluation 09/10/15   PT Start Time 1030   PT Stop Time 1117   PT Time Calculation (min) 47 min      Past Medical History  Diagnosis Date  . Hyperlipemia     takes Lipitor and Fenofibrate daily  . Hypertension     takes Lisinopril daily  . Weakness     numbness and tingling down right leg  . Chronic back pain     HNP   . GERD (gastroesophageal reflux disease)     has taken Zantac daily  . History of kidney stones   . Anxiety     takes Valium daily as needed  . Insomnia     coming from being in pain    Past Surgical History  Procedure Laterality Date  . Hernia repair Right     inguinal   . Lumbar laminectomy/decompression microdiscectomy N/A 05/28/2015    Procedure: L3-S1 HEMILAMINOTOMY, DECOMPRESSION, DISCECTOMY  (3  LEVELS);  Surgeon: Venita Lick, MD;  Location: [facility] OR;  Service: Orthopedics;  Laterality: N/A;    There were no vitals filed for this visit.  Visit Diagnosis:  Right low back pain, with sciatica presence unspecified  Decreased range of motion of lumbar spine      Subjective Assessment - 07/31/15 1055    Subjective No new complaints. He states no pain today. RT Great toe still numb   How long can you sit comfortably? 20 minutes.   Patient Stated Goals Get out of pain and back to work.   Currently in Pain? No/denies   Pain Score 2    Pain Location Back  Great toe numbness   Pain Orientation Right   Pain Descriptors / Indicators Numbness;Tingling   Pain Type Surgical pain   Pain Onset More than a month ago   Pain Frequency  Intermittent   Aggravating Factors  prolonged sitting   Pain Relieving Factors resting and walking                         [facility] Adult PT Treatment/Exercise - 07/31/15 0001    Exercises   Exercises Lumbar;Knee/Hip   Lumbar Exercises: Stretches   Active Hamstring Stretch 5 reps;30 seconds  Both LEs with Ankle pumps for nerve glide (gentle on RT)   Lumbar Exercises: Standing   Heel Raises 10 reps   Lumbar Exercises: Supine   Ab Set 10 reps;5 seconds  TCs and VCs for correct TrAb engagement   Bent Knee Raise 10 reps;5 seconds   Bridge 10 reps;5 seconds   Other Supine Lumbar Exercises posterior pelvic tilts 2x10   Lumbar Exercises: Quadruped   Madcat/Old Horse 10 reps   Knee/Hip Exercises: Aerobic   Elliptical Ramp 3 , L5 x 9 min   Knee/Hip Exercises: Seated   Sit to Sand 1 set;10 reps  Functional movement pattern with drawin  Supine SKTC x 3 hold 30-60 secs both LEs                 PT Long Term Goals - 07/23/15 1017    PT LONG TERM GOAL #1   Title Ind with advanced HEP.   Time 4   Period Weeks   Status New   PT LONG TERM GOAL #2   Title Right hip flexion= 115 degrees.   Time 4   Period Weeks   Status New   PT LONG TERM GOAL #3   Title Perform ADL's with pain not > 2/10.   Time 4   Period Weeks   Status New   PT LONG TERM GOAL #4   Title Eliminate right LE symptoms.   Time 4   Period Weeks   Status New   PT LONG TERM GOAL #5   Title Return to work.   Time 4   Period Weeks   Status New               Plan - 07/31/15 1216    Clinical Impression Statement Pt did much better today with core and mobility exercises. He was also moving better with sit to stand and other transitional movements.   Pt will benefit from skilled therapeutic intervention in order to improve on the following deficits Pain;Decreased activity tolerance   Rehab  Potential Excellent   PT Frequency 3x / week   PT Duration 4 weeks   PT Treatment/Interventions ADLs/Self Care Home Management;Cryotherapy;Electrical Stimulation;Moist Heat;Ultrasound;Therapeutic activities;Therapeutic exercise;Manual techniques   PT Next Visit Plan Follow microdiscectomy protocol.  Right SKTC. Contnue elliptical and low level core exercises. Practice sit to stand   Consulted and Agree with Plan of Care Patient        Problem List Patient Active Problem List   Diagnosis Date Noted  . Back pain 05/28/2015    RAMSEUR,CHRIS, PTA         07/31/2015, 12:23 PM  [facility] [address] [place], [place], [address] Phone: [phone]   Fax:  [phone]

## 2015-08-05 ENCOUNTER — Ambulatory Visit: Payer: BLUE CROSS/BLUE SHIELD | Admitting: *Deleted

## 2015-08-05 ENCOUNTER — Encounter: Payer: Self-pay | Admitting: *Deleted

## 2015-08-05 DIAGNOSIS — M545 Low back pain: Secondary | ICD-10-CM | POA: Diagnosis not present

## 2015-08-05 DIAGNOSIS — M5386 Other specified dorsopathies, lumbar region: Secondary | ICD-10-CM

## 2015-08-05 NOTE — Therapy (Signed)
[facility] [address] [place], [place], [address] Phone: [phone]   Fax:  [phone]  Physical Therapy Treatment  Patient Details  Name: Thomas York MRN: [id number] Date of Birth: 1981/08/28 Referring Provider:  Richmond Campbell., PA-C  Encounter Date: 08/05/2015      PT End of Session - 08/05/15 1126    Visit Number 5   Number of Visits 12   Date for PT Re-Evaluation 09/10/15   PT Start Time 1115   PT Stop Time 1205   PT Time Calculation (min) 50 min      Past Medical History  Diagnosis Date  . Hyperlipemia     takes Lipitor and Fenofibrate daily  . Hypertension     takes Lisinopril daily  . Weakness     numbness and tingling down right leg  . Chronic back pain     HNP   . GERD (gastroesophageal reflux disease)     has taken Zantac daily  . History of kidney stones   . Anxiety     takes Valium daily as needed  . Insomnia     coming from being in pain    Past Surgical History  Procedure Laterality Date  . Hernia repair Right     inguinal   . Lumbar laminectomy/decompression microdiscectomy N/A 05/28/2015    Procedure: L3-S1 HEMILAMINOTOMY, DECOMPRESSION, DISCECTOMY  (3  LEVELS);  Surgeon: Venita Lick, MD;  Location: [facility] OR;  Service: Orthopedics;  Laterality: N/A;    There were no vitals filed for this visit.  Visit Diagnosis:  Right low back pain, with sciatica presence unspecified  Decreased range of motion of lumbar spine      Subjective Assessment - 08/05/15 1120    Subjective No new complaints. He states no pain today. RT Great toe still numb   Limitations Sitting   How long can you sit comfortably? 20 minutes.   Patient Stated Goals Get out of pain and back to work.   Currently in Pain? No/denies   Pain Location Back   Pain Orientation Right   Pain Descriptors / Indicators Tingling;Numbness  IN Great TOE RT side   Pain Type Surgical pain   Pain Onset More than a month ago   Pain  Frequency Intermittent                         [facility] Adult PT Treatment/Exercise - 08/05/15 0001    Exercises   Exercises Lumbar;Knee/Hip   Lumbar Exercises: Stretches   Piriformis Stretch --  Both LEs   Lumbar Exercises: Supine   Ab Set 10 reps;5 seconds  TCs and VCs for correct TrAb engagement   Bent Knee Raise 10 reps;5 seconds   Bridge 10 reps;5 seconds;20 reps   Other Supine Lumbar Exercises posterior pelvic tilts 2x10   Lumbar Exercises: Quadruped   Madcat/Old Horse 10 reps   Straight Leg Raise 10 reps;2 seconds  slight rotation in pelvis   Knee/Hip Exercises: Aerobic   Elliptical Ramp 3 , L5 x 10 min   Knee/Hip Exercises: Standing   Rocker Board 4 minutes  calf stretching and balance   SLS Bil LEs with drawin, keep pelvis level   Knee/Hip Exercises: Seated   Sit to Sand 10 reps;2 sets  Functional movement pattern with drawin with no UE Assistanc                     PT Long Term Goals -  07/23/15 1017    PT LONG TERM GOAL #1   Title Ind with advanced HEP.   Time 4   Period Weeks   Status New   PT LONG TERM GOAL #2   Title Right hip flexion= 115 degrees.   Time 4   Period Weeks   Status New   PT LONG TERM GOAL #3   Title Perform ADL's with pain not > 2/10.   Time 4   Period Weeks   Status New   PT LONG TERM GOAL #4   Title Eliminate right LE symptoms.   Time 4   Period Weeks   Status New   PT LONG TERM GOAL #5   Title Return to work.   Time 4   Period Weeks   Status New               Plan - 08/05/15 1202    Clinical Impression Statement Pt continues toprogress with ex.s and act.'s per protocol and was able to perform QP leg raise without increased pain, but still lacks stabilty in pelvis. Much better now with sit to stand with no UE assist and less guarding. He still C/O cramps in lower RT leg at times and Great toe numness   Pt will benefit from skilled therapeutic intervention in order to improve on the following  deficits Pain;Decreased activity tolerance   Rehab Potential Excellent   PT Frequency 3x / week   PT Duration 4 weeks   PT Treatment/Interventions ADLs/Self Care Home Management;Cryotherapy;Electrical Stimulation;Moist Heat;Ultrasound;Therapeutic activities;Therapeutic exercise;Manual techniques   PT Next Visit Plan Follow microdiscectomy protocol.  Right SKTC. Contnue elliptical and low level core exercises. Practice sit to stand   Consulted and Agree with Plan of Care Patient        Problem List Patient Active Problem List   Diagnosis Date Noted  . Back pain 05/28/2015    Dionna Wiedemann,CHRIS, PTA 08/05/2015, 12:08 PM  [facility] [address] [place], [place], [address] Phone: [phone]   Fax:  [phone]

## 2015-08-07 ENCOUNTER — Ambulatory Visit: Payer: BLUE CROSS/BLUE SHIELD | Admitting: Physical Therapy

## 2015-08-07 DIAGNOSIS — M5386 Other specified dorsopathies, lumbar region: Secondary | ICD-10-CM

## 2015-08-07 DIAGNOSIS — M545 Low back pain: Secondary | ICD-10-CM | POA: Diagnosis not present

## 2015-08-07 NOTE — Therapy (Signed)
[facility] [address] [place], [place], [address] Phone: [phone]   Fax:  [phone]  Physical Therapy Treatment  Patient Details  Name: Thomas York MRN: [id number] Date of Birth: 1981/04/04 Referring Provider:  Richmond Campbell., PA-C  Encounter Date: 08/07/2015      PT End of Session - 08/07/15 0903    Visit Number 6   Number of Visits 12   Date for PT Re-Evaluation 09/10/15   PT Start Time 0902   PT Stop Time 0945   PT Time Calculation (min) 43 min   Activity Tolerance Patient tolerated treatment well   Behavior During Therapy [facility] for tasks assessed/performed      Past Medical History  Diagnosis Date  . Hyperlipemia     takes Lipitor and Fenofibrate daily  . Hypertension     takes Lisinopril daily  . Weakness     numbness and tingling down right leg  . Chronic back pain     HNP   . GERD (gastroesophageal reflux disease)     has taken Zantac daily  . History of kidney stones   . Anxiety     takes Valium daily as needed  . Insomnia     coming from being in pain    Past Surgical History  Procedure Laterality Date  . Hernia repair Right     inguinal   . Lumbar laminectomy/decompression microdiscectomy N/A 05/28/2015    Procedure: L3-S1 HEMILAMINOTOMY, DECOMPRESSION, DISCECTOMY  (3  LEVELS);  Surgeon: Venita Lick, MD;  Location: [facility] OR;  Service: Orthopedics;  Laterality: N/A;    There were no vitals filed for this visit.  Visit Diagnosis:  Right low back pain, with sciatica presence unspecified  Decreased range of motion of lumbar spine      Subjective Assessment - 08/07/15 0903    Subjective No new complaints. Returns to MD 08/26/15.   Currently in Pain? No/denies                         [facility] Adult PT Treatment/Exercise - 08/07/15 0001    Lumbar Exercises: Stretches   Active Hamstring Stretch 2 reps;30 seconds   Lumbar Exercises: Seated   Hip Flexion on Ball  Strengthening;Both;10 reps   Lumbar Exercises: Supine   Bent Knee Raise 20 reps  up/up/down/down   Bridge 10 reps  with SLR; some cramping in peroneals R after.   Lumbar Exercises: Quadruped   Single Arm Raise 5 reps;5 seconds   Straight Leg Raise 10 reps;5 seconds  slight rotation in pelvis; corrects with cues   Straight Leg Raises Limitations watch wrist tenonitis   Opposite Arm/Leg Raise Limitations attempted but too difficult   Knee/Hip Exercises: Aerobic   Elliptical Ramp 3 , L5 x 10 min                     PT Long Term Goals - 07/23/15 1017    PT LONG TERM GOAL #1   Title Ind with advanced HEP.   Time 4   Period Weeks   Status New   PT LONG TERM GOAL #2   Title Right hip flexion= 115 degrees.   Time 4   Period Weeks   Status New   PT LONG TERM GOAL #3   Title Perform ADL's with pain not > 2/10.   Time 4   Period Weeks   Status New   PT LONG TERM GOAL #4  Title Eliminate right LE symptoms.   Time 4   Period Weeks   Status New   PT LONG TERM GOAL #5   Title Return to work.   Time 4   Period Weeks   Status New               Plan - 08/07/15 1148    Clinical Impression Statement Patient tolerated increase in therex well without pain. Pt c/o of lateral lower leg cramping. Stretch was provided. Continues to demo core weakness with advanced exercises.   PT Next Visit Plan Follow microdiscectomy protocol.   Contnue elliptical and core exercises.        Problem List Patient Active Problem List   Diagnosis Date Noted  . Back pain 05/28/2015    Solon Palm PT  08/07/2015, 11:51 AM  [facility] [address] [place], [place], [address] Phone: [phone]   Fax:  [phone]

## 2015-08-12 ENCOUNTER — Ambulatory Visit: Payer: BLUE CROSS/BLUE SHIELD | Admitting: *Deleted

## 2015-08-12 ENCOUNTER — Encounter: Payer: Self-pay | Admitting: *Deleted

## 2015-08-12 DIAGNOSIS — M545 Low back pain: Secondary | ICD-10-CM

## 2015-08-12 DIAGNOSIS — M5386 Other specified dorsopathies, lumbar region: Secondary | ICD-10-CM

## 2015-08-12 NOTE — Therapy (Signed)
[facility] [address] [place], [place], [address] Phone: [phone]   Fax:  [phone]  Physical Therapy Treatment  Patient Details  Name: Thomas York MRN: [id number] Date of Birth: [birth year] Referring Provider:  Richmond Campbell., PA-C  Encounter Date: 08/12/2015      PT End of Session - 08/12/15 1111    Visit Number 7   Number of Visits 12   Date for PT Re-Evaluation 09/10/15   PT Start Time 1031   PT Stop Time 1121   PT Time Calculation (min) 50 min      Past Medical History  Diagnosis Date  . Hyperlipemia     takes Lipitor and Fenofibrate daily  . Hypertension     takes Lisinopril daily  . Weakness     numbness and tingling down right leg  . Chronic back pain     HNP   . GERD (gastroesophageal reflux disease)     has taken Zantac daily  . History of kidney stones   . Anxiety     takes Valium daily as needed  . Insomnia     coming from being in pain    Past Surgical History  Procedure Laterality Date  . Hernia repair Right     inguinal   . Lumbar laminectomy/decompression microdiscectomy N/A 05/28/2015    Procedure: L3-S1 HEMILAMINOTOMY, DECOMPRESSION, DISCECTOMY  (3  LEVELS);  Surgeon: Venita Lick, MD;  Location: [facility] OR;  Service: Orthopedics;  Laterality: N/A;    There were no vitals filed for this visit.  Visit Diagnosis:  Right low back pain, with sciatica presence unspecified  Decreased range of motion of lumbar spine      Subjective Assessment - 08/12/15 1109    Subjective  Returns to MD 08/26/15. LT HS is sore from walking. no LBP   Limitations Sitting   How long can you sit comfortably? 20 minutes.   Patient Stated Goals Get out of pain and back to work.   Currently in Pain? No/denies                         [facility] Adult PT Treatment/Exercise - 08/12/15 0001    Exercises   Exercises Lumbar;Knee/Hip   Lumbar Exercises: Standing   Other Standing Lumbar Exercises 4#  ball one step raises 2x10 to each side   Lumbar Exercises: Supine   Bridge 10 reps;3 seconds  with marching x 2x10, and SLR 2x10   Bridge Limitations challenging for single leg stabilization   Lumbar Exercises: Quadruped   Single Arm Raise 5 seconds;20 reps   Straight Leg Raise 20 reps;5 seconds  slight rotation in pelvis; corrects with cues   Straight Leg Raises Limitations watch wrist tendonitis   Knee/Hip Exercises: Aerobic   Elliptical Ramp 10, L5 x 17 min   Knee/Hip Exercises: Standing   Rocker Board 5 minutes  calf stretching and balance                     PT Long Term Goals - 07/23/15 1017    PT LONG TERM GOAL #1   Title Ind with advanced HEP.   Time 4   Period Weeks   Status New   PT LONG TERM GOAL #2   Title Right hip flexion= 115 degrees.   Time 4   Period Weeks   Status New   PT LONG TERM GOAL #3   Title Perform ADL's with pain not >  2/10.   Time 4   Period Weeks   Status New   PT LONG TERM GOAL #4   Title Eliminate right LE symptoms.   Time 4   Period Weeks   Status New   PT LONG TERM GOAL #5   Title Return to work.   Time 4   Period Weeks   Status New               Plan - 08/12/15 1201    Clinical Impression Statement Pt did fairly well with core ex.'s and functional movement patterns today. He still shakes at times  with certain core exs., but was able to progress bridging exs today and go longer on elliptical.   Pt will benefit from skilled therapeutic intervention in order to improve on the following deficits Pain;Decreased activity tolerance   Rehab Potential Excellent   PT Duration 4 weeks   PT Treatment/Interventions ADLs/Self Care Home Management;Cryotherapy;Electrical Stimulation;Moist Heat;Ultrasound;Therapeutic activities;Therapeutic exercise;Manual techniques   PT Next Visit Plan Follow microdiscectomy protocol.   Contnue elliptical and core exercises.   Consulted and Agree with Plan of Care Patient         Problem List Patient Active Problem List   Diagnosis Date Noted  . Back pain 05/28/2015    Tameah Mihalko,CHRIS, PTA 08/12/2015, 12:08 PM  [facility] [address] [place], [place], [address] Phone: [phone]   Fax:  [phone]

## 2015-08-14 ENCOUNTER — Encounter: Payer: BLUE CROSS/BLUE SHIELD | Admitting: *Deleted

## 2015-08-19 ENCOUNTER — Encounter: Payer: Self-pay | Admitting: *Deleted

## 2015-08-19 ENCOUNTER — Ambulatory Visit: Payer: BLUE CROSS/BLUE SHIELD | Attending: Orthopedic Surgery | Admitting: *Deleted

## 2015-08-19 DIAGNOSIS — M5386 Other specified dorsopathies, lumbar region: Secondary | ICD-10-CM

## 2015-08-19 DIAGNOSIS — M2560 Stiffness of unspecified joint, not elsewhere classified: Secondary | ICD-10-CM | POA: Insufficient documentation

## 2015-08-19 DIAGNOSIS — M545 Low back pain: Secondary | ICD-10-CM

## 2015-08-19 NOTE — Therapy (Signed)
[facility] [address] [place], [place], [address] Phone: [phone]   Fax:  [phone]  Physical Therapy Treatment  Patient Details  Name: Thomas York MRN: [id number] Date of Birth: 27-Jan-1981 Referring Provider:  Richmond Campbell., PA-C  Encounter Date: 08/19/2015      PT End of Session - 08/19/15 1552    Visit Number 8   Number of Visits 12   Date for PT Re-Evaluation 09/10/15   PT Start Time 1515   PT Stop Time 1602   PT Time Calculation (min) 47 min   Activity Tolerance Patient tolerated treatment well   Behavior During Therapy [facility] for tasks assessed/performed      Past Medical History  Diagnosis Date  . Hyperlipemia     takes Lipitor and Fenofibrate daily  . Hypertension     takes Lisinopril daily  . Weakness     numbness and tingling down right leg  . Chronic back pain     HNP   . GERD (gastroesophageal reflux disease)     has taken Zantac daily  . History of kidney stones   . Anxiety     takes Valium daily as needed  . Insomnia     coming from being in pain    Past Surgical History  Procedure Laterality Date  . Hernia repair Right     inguinal   . Lumbar laminectomy/decompression microdiscectomy N/A 05/28/2015    Procedure: L3-S1 HEMILAMINOTOMY, DECOMPRESSION, DISCECTOMY  (3  LEVELS);  Surgeon: Venita Lick, MD;  Location: [facility] OR;  Service: Orthopedics;  Laterality: N/A;    There were no vitals filed for this visit.  Visit Diagnosis:  Right low back pain, with sciatica presence unspecified  Decreased range of motion of lumbar spine      Subjective Assessment - 08/19/15 1522    Subjective  Returns to MD 08/26/15. LT HS is sore from walking. no LBP Was able to push mow my yard with no pain. Doing better. No cramps the last few days   Limitations Sitting   How long can you sit comfortably? 20 minutes.   Patient Stated Goals Get out of pain and back to work.   Currently in Pain? No/denies                          [facility] Adult PT Treatment/Exercise - 08/19/15 0001    Exercises   Exercises Lumbar;Knee/Hip   Lumbar Exercises: Standing   Other Standing Lumbar Exercises 4# ball one step raises 2x20 to each side   Lumbar Exercises: Quadruped   Single Arm Raise 5 seconds;20 reps   Straight Leg Raise 20 reps;5 seconds  slight rotation in pelvis; corrects with cues   Straight Leg Raises Limitations watch wrist tendonitis   Opposite Arm/Leg Raise Limitations attempted but too difficult   Knee/Hip Exercises: Aerobic   Elliptical Ramp 10, L5 x 17 min   Knee/Hip Exercises: Standing   Rocker Board 5 minutes  calf stretching and balance   SLS Bil LEs with drawin, keep pelvis level x 5 each LE   Other Standing Knee Exercises Balance on BOSU   Knee/Hip Exercises: Seated   Sit to Sand 10 reps;2 sets;without UE support  Extend UEs on the way down                     PT Long Term Goals - 08/19/15 1723    PT LONG TERM GOAL #1  Title Ind with advanced HEP.   Period Weeks   Status On-going   PT LONG TERM GOAL #2   Title Right hip flexion= 115 degrees.   Time 4   Period Weeks   Status On-going   PT LONG TERM GOAL #3   Title Perform ADL's with pain not > 2/10.   Time 4   Period Weeks   Status Achieved   PT LONG TERM GOAL #4   Title Eliminate right LE symptoms.   Time 4   Period Weeks   Status On-going   PT LONG TERM GOAL #5   Title Return to work.   Time 4   Period Weeks   Status On-going               Plan - 08/19/15 1555    Clinical Impression Statement Pt did well again with RX and continues to progress with Exs and Act.'s. He wants to return to work    Pt will benefit from skilled therapeutic intervention in order to improve on the following deficits Pain;Decreased activity tolerance   Rehab Potential Excellent   PT Frequency 3x / week   PT Duration 4 weeks   PT Treatment/Interventions ADLs/Self Care Home  Management;Cryotherapy;Electrical Stimulation;Moist Heat;Ultrasound;Therapeutic activities;Therapeutic exercise;Manual techniques   PT Next Visit Plan Follow microdiscectomy protocol.   Contnue elliptical and core exercises.   Consulted and Agree with Plan of Care Patient        Problem List Patient Active Problem List   Diagnosis Date Noted  . Back pain 05/28/2015    RAMSEUR,CHRIS, PTA 08/19/2015, 5:25 PM  [facility] [address] [place], [place], [address] Phone: [phone]   Fax:  [phone]

## 2015-08-25 ENCOUNTER — Encounter: Payer: Self-pay | Admitting: Physical Therapy

## 2015-08-25 ENCOUNTER — Ambulatory Visit: Payer: BLUE CROSS/BLUE SHIELD | Admitting: Physical Therapy

## 2015-08-25 DIAGNOSIS — M545 Low back pain: Secondary | ICD-10-CM

## 2015-08-25 DIAGNOSIS — M5386 Other specified dorsopathies, lumbar region: Secondary | ICD-10-CM

## 2015-08-25 NOTE — Therapy (Addendum)
[facility] [place], [place], [address] Phone: [phone]   Fax:  [phone]  Physical Therapy Treatment  Patient Details  Name: KIANDRE SPAGNOLO MRN: [id number] Date of Birth: 07/14/1981 Referring Provider:  Aletha Halim., PA-C  Encounter Date: 08/25/2015      PT End of Session - 08/25/15 1435    Visit Number 9   Number of Visits 12   Date for PT Re-Evaluation 09/10/15   PT Start Time 1430   PT Stop Time 1505   PT Time Calculation (min) 35 min   Activity Tolerance Patient tolerated treatment well   Behavior During Therapy [facility] for tasks assessed/performed      Past Medical History  Diagnosis Date  . Hyperlipemia     takes Lipitor and Fenofibrate daily  . Hypertension     takes Lisinopril daily  . Weakness     numbness and tingling down right leg  . Chronic back pain     HNP   . GERD (gastroesophageal reflux disease)     has taken Zantac daily  . History of kidney stones   . Anxiety     takes Valium daily as needed  . Insomnia     coming from being in pain    Past Surgical History  Procedure Laterality Date  . Hernia repair Right     inguinal   . Lumbar laminectomy/decompression microdiscectomy N/A 05/28/2015    Procedure: L3-S1 HEMILAMINOTOMY, DECOMPRESSION, DISCECTOMY  (3  LEVELS);  Surgeon: Melina Schools, MD;  Location: [place];  Service: Orthopedics;  Laterality: N/A;    There were no vitals filed for this visit.  Visit Diagnosis:  Right low back pain, with sciatica presence unspecified  Decreased range of motion of lumbar spine      Subjective Assessment - 08/25/15 1434    Subjective Returns to Dr. Rolena Infante 08/26/2015. Reports being able to stretch better and is able to do basically all ADLs. No cramps reported today.   Limitations Sitting   How long can you sit comfortably? 20 minutes.   Patient Stated Goals Get out of pain and back to work.   Currently in Pain? No/denies             [facility] PT Assessment - 08/25/15 0001    Assessment   Medical Diagnosis Lumbar microdiscectomy.   Onset Date/Surgical Date 05/28/15   Next MD Visit 08/26/2015   ROM / Strength   AROM / PROM / Strength AROM   AROM   Overall AROM  Within functional limits for tasks performed   AROM Assessment Site Hip   Right/Left Hip Right   Right Hip Flexion 120                     [facility] Adult PT Treatment/Exercise - 08/25/15 0001    Lumbar Exercises: Aerobic   Elliptical Ramp 10, Resistance  5 x15 min   Lumbar Exercises: Standing   Other Standing Lumbar Exercises 4# ball one step raises 2x20 to each side   Other Standing Lumbar Exercises DLS on BOSU x4 min with drawin   Lumbar Exercises: Quadruped   Single Arm Raise Right;Left;20 reps;2 seconds   Straight Leg Raise 20 reps;2 seconds   Knee/Hip Exercises: Standing   Rocker Board 4 minutes;Other (comment)  2 min stretch, 2 min balance   SLS Bil LEs with drawin, keep pelvis level x 5 each LE  PT Long Term Goals - 08/25/15 1436    PT LONG TERM GOAL #1   Title Ind with advanced HEP.   Period Weeks   Status Achieved   PT LONG TERM GOAL #2   Title Right hip flexion= 115 degrees.   Time 4   Period Weeks   Status Achieved  AROM in sitting 120 deg R hip   PT LONG TERM GOAL #3   Title Perform ADL's with pain not > 2/10.   Time 4   Period Weeks   Status Achieved   PT LONG TERM GOAL #4   Title Eliminate right LE symptoms.   Time 4   Period Weeks   Status Achieved   PT LONG TERM GOAL #5   Title Return to work.   Time 4   Period Weeks   Status On-going  Patient reports that he is supposed to return to work 08/28/2015               Plan - 08/25/15 1506    Clinical Impression Statement Patient tolerated treatment well today with no complaints of pain today. Has achieved all goals set at evaluation except for return to work which patient reports that he is supposed to return to work  08/28/2015. All exercises were completed with proper technique and only required minimal verbal cueing for level pelvis during quadruped exercises. Denied pain following treatment.   Pt will benefit from skilled therapeutic intervention in order to improve on the following deficits Pain;Decreased activity tolerance   Rehab Potential Excellent   PT Frequency 3x / week   PT Duration 4 weeks   PT Treatment/Interventions ADLs/Self Care Home Management;Cryotherapy;Electrical Stimulation;Moist Heat;Ultrasound;Therapeutic activities;Therapeutic exercise;Manual techniques   PT Next Visit Plan Follow microdiscectomy protocol.   Contnue elliptical and core exercises.   Consulted and Agree with Plan of Care Patient        Problem List Patient Active Problem List   Diagnosis Date Noted  . Back pain 05/28/2015    Kelsey Parsons, PTA 08/25/2015 4:56 PM Chad Applegate MPT [facility] [address] [place], [place], [address] Phone: [phone]   Fax:  [phone]  PHYSICAL THERAPY DISCHARGE SUMMARY  Visits from Start of Care: 9.  Current functional level related to goals / functional outcomes: Please see above.   Remaining deficits: Goals essentially met.   Education / Equipment: HEP. Plan: Patient agrees to discharge.  Patient goals were met. Patient is being discharged due to meeting the stated rehab goals.  ?????         Chad Applegate MPT   

## 2015-09-29 IMAGING — CR DG FOOT COMPLETE 3+V*L*
3 series · 3 of 3 positions shown · non-contrast
Comparison: LEFT foot radiograph December 11, 2013

CLINICAL DATA: Painful lump on dorsum of foot today, no injury.

EXAM:
LEFT FOOT - COMPLETE 3+ VIEW

[x foot ap left]
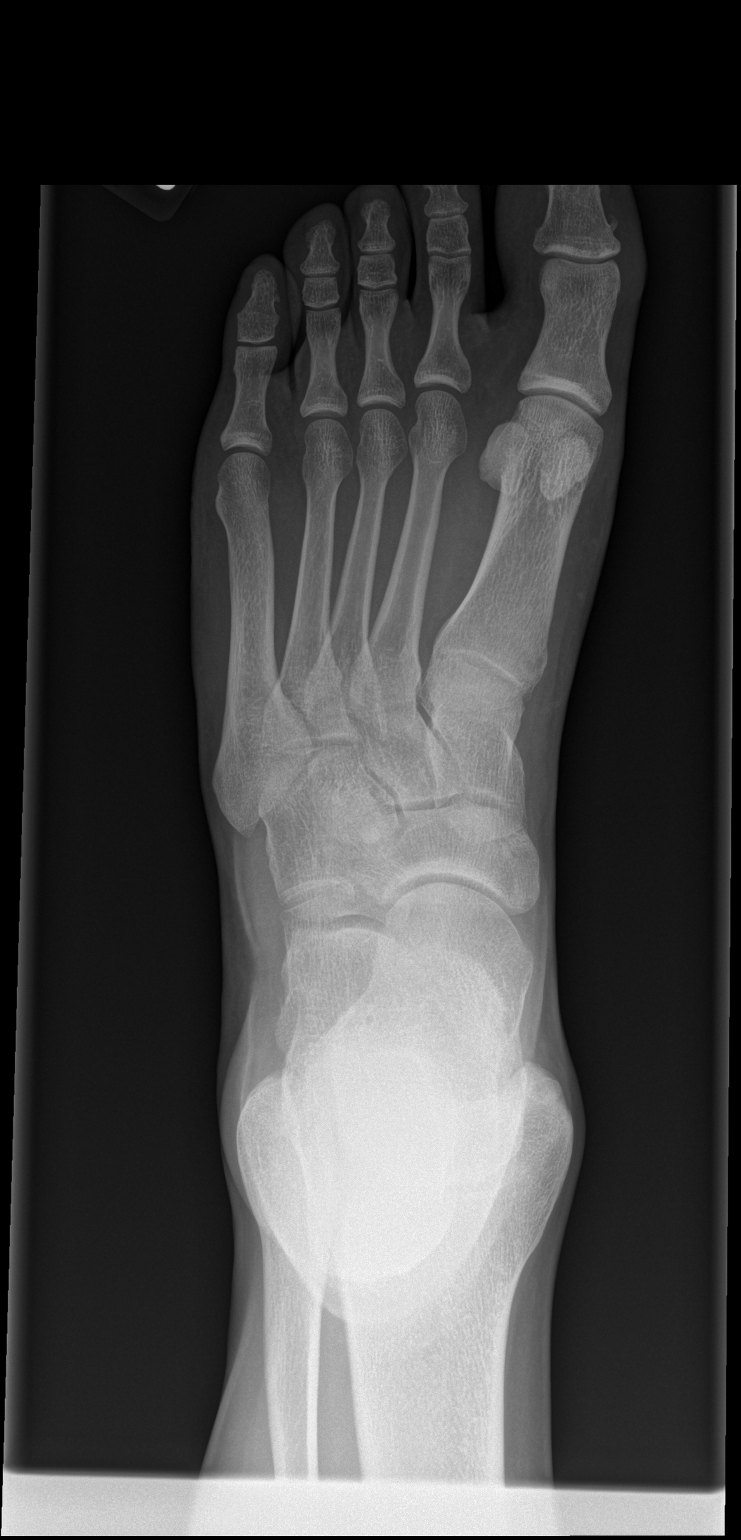

[x foot obl left]
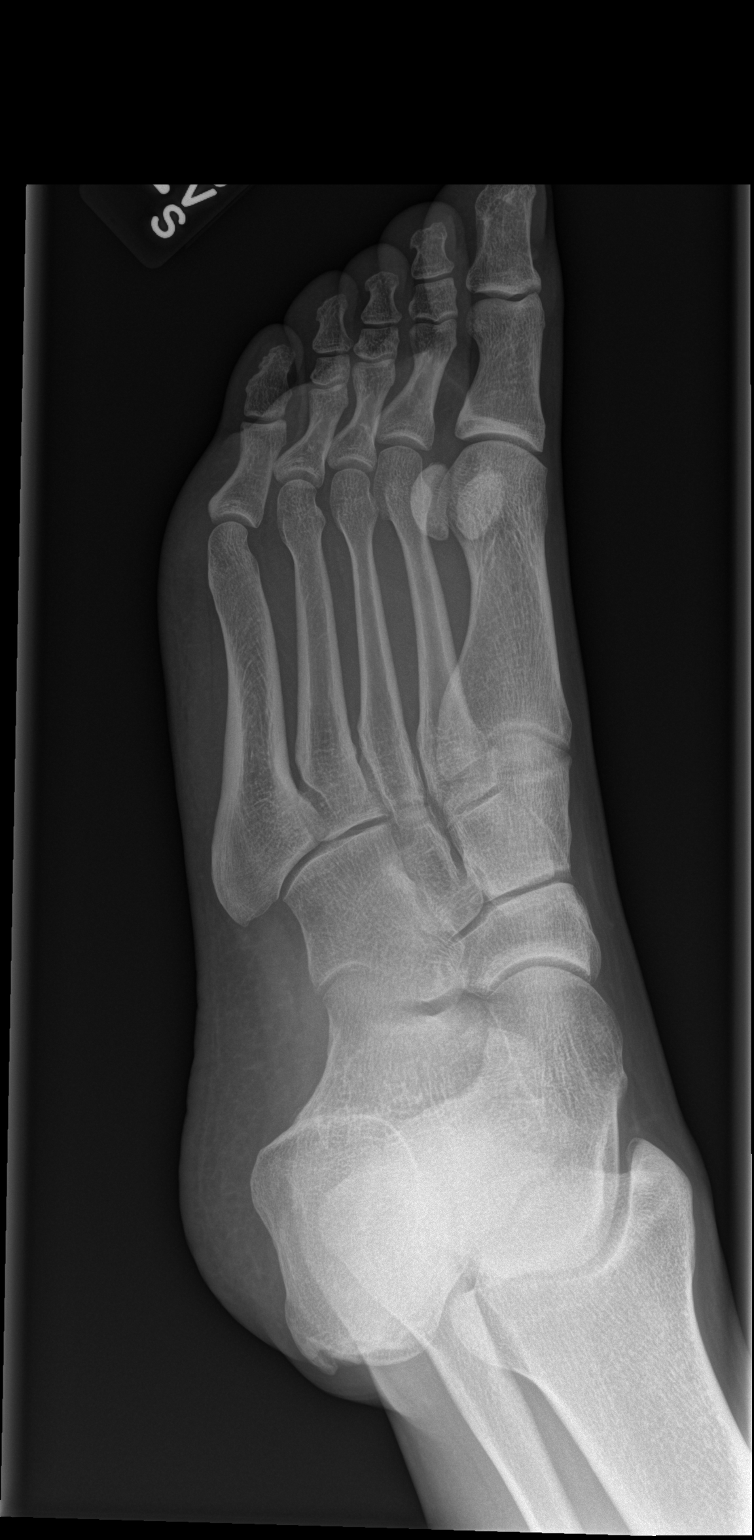

[x foot lat left]
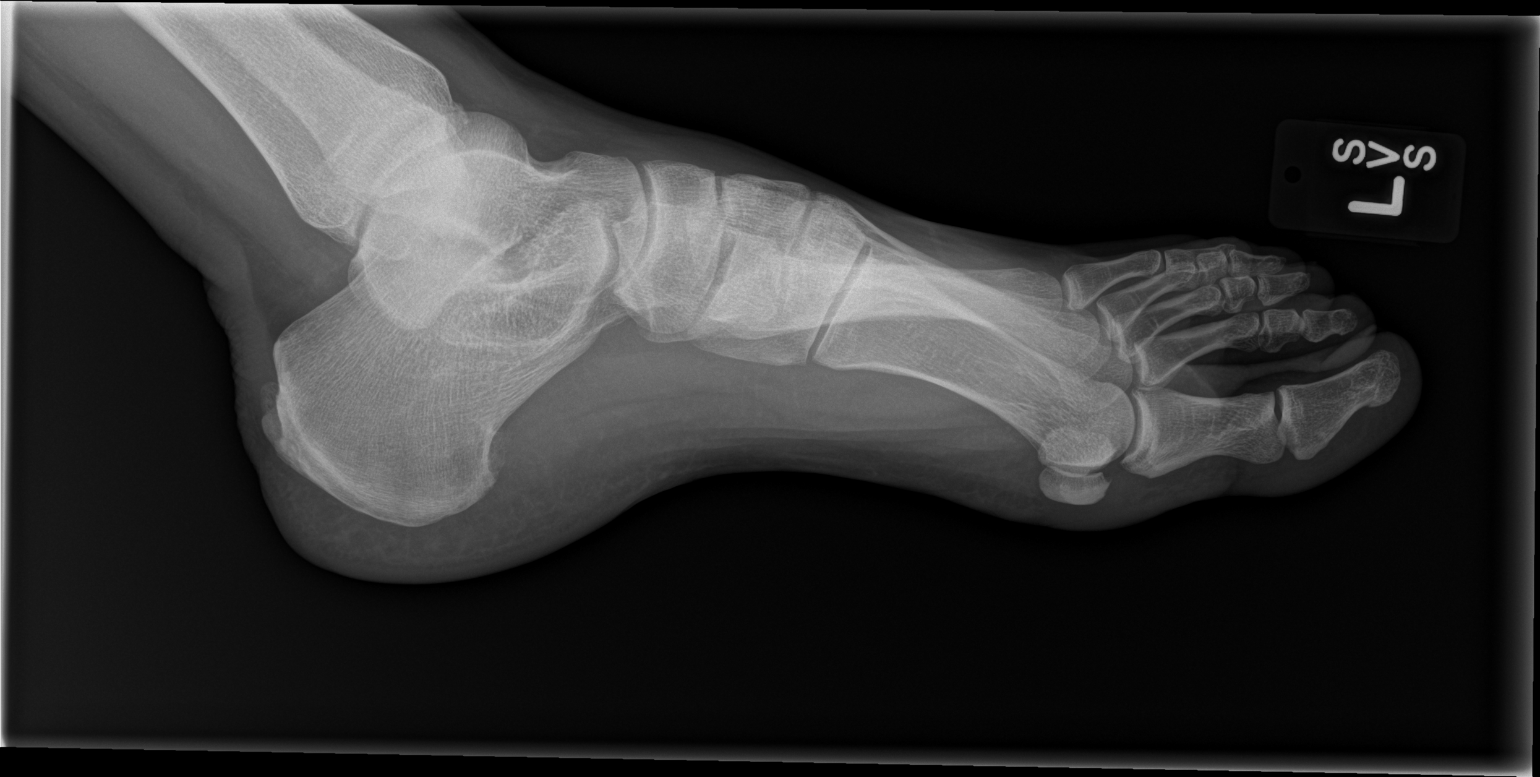

[3 of 3 positions shown; findings below may reference images not displayed]

FINDINGS: No acute fracture deformity or dislocation. Joint space intact
without erosions. No destructive bony lesions. Soft tissue planes
are not suspicious.
IMPRESSION: Negative.

## 2016-11-01 ENCOUNTER — Encounter: Payer: Self-pay | Admitting: Family Medicine

## 2016-11-01 ENCOUNTER — Ambulatory Visit (INDEPENDENT_AMBULATORY_CARE_PROVIDER_SITE_OTHER): Payer: BLUE CROSS/BLUE SHIELD | Admitting: Family Medicine

## 2016-11-01 VITALS — BP 134/78 | HR 92 | Temp 97.3°F | Ht 71.5 in | Wt 224.0 lb

## 2016-11-01 DIAGNOSIS — E785 Hyperlipidemia, unspecified: Secondary | ICD-10-CM

## 2016-11-01 DIAGNOSIS — I10 Essential (primary) hypertension: Secondary | ICD-10-CM

## 2016-11-01 DIAGNOSIS — M7711 Lateral epicondylitis, right elbow: Secondary | ICD-10-CM | POA: Diagnosis not present

## 2016-11-01 DIAGNOSIS — R739 Hyperglycemia, unspecified: Secondary | ICD-10-CM

## 2016-11-01 MED ORDER — FENOFIBRATE MICRONIZED 130 MG PO CAPS: 130.0000 mg | ORAL_CAPSULE | Freq: Every day | ORAL | 2 refills | Status: DC

## 2016-11-01 MED ORDER — PREDNISONE 20 MG PO TABS: ORAL_TABLET | ORAL | 0 refills | Status: DC

## 2016-11-01 NOTE — Progress Notes (Signed)
BP 134/78   Pulse 92   Temp 97.3 F (36.3 C) (Oral)   Ht 5' 11.5" (1.816 m)   Wt 224 lb (101.6 kg)   BMI 30.81 kg/m    Subjective:    Patient ID: Thomas York, male    DOB: 09-15-1981, 35 y.o.   MRN: [id number]  HPI: Thomas York is a 35 y.o. male presenting on 11/01/2016 for Establish Care   HPI Hyperlipidemia Patient is coming to establish care with our office and in for a recheck on his cholesterol. He is currently taking Lipitor and fenofibrate. He says his last check was over a year ago. He is fasting today and is ready to go ahead and check it.  Hypertension recheck Patient is on lisinopril 5 mg daily for his blood pressure. His blood pressure today is 134/78. He has been on this medication for quite some time along with the cholesterol pills. He was previously seen a doctor in [place] named Terrill Mohr prior to coming here. Patient denies headaches, blurred vision, chest pains, shortness of breath, or weakness. Denies any side effects from medication and is content with current medication.   Elbow pain or numbness Patient has right lateral elbow pain from repetitive movement at work. 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 He is having the pain on the right lateral aspect of his elbow but then he is also having numbness going down into his fourth and fifth fingers on that hand. He was concerned about the numbness and where the source of the numbness might be coming from. He denies any neck pain or recent neck trauma.  Relevant past medical, surgical, family and social history reviewed and updated as indicated. Interim medical history since our last visit reviewed. Allergies and medications reviewed and updated.  Review of Systems  Constitutional: Negative for chills and fever.  Respiratory: Negative for shortness of breath and wheezing.   Cardiovascular: Negative for chest pain, palpitations and leg  swelling.  Endocrine: Negative for cold intolerance and heat intolerance.  Musculoskeletal: Positive for arthralgias. Negative for back pain, gait problem and myalgias.  Skin: Negative for rash.  Neurological: Positive for numbness. Negative for dizziness, weakness, light-headedness and headaches.  All other systems reviewed and are negative.  Per HPI unless specifically indicated above  Social History   Social History  . Marital status: Single    Spouse name: N/A  . Number of children: N/A  . Years of education: N/A   Occupational History  . Not on file.   Social History Main Topics  . Smoking status: Never Smoker  . Smokeless tobacco: Never Used  . Alcohol use Yes     Comment: rare  . Drug use: No  . Sexual activity: Yes    Birth control/ protection: Condom     Comment: male partner for 1 year   Other Topics Concern  . Not on file   Social History Narrative  . No narrative on file    Past Surgical History:  Procedure Laterality Date  . HERNIA REPAIR Right    inguinal   . LUMBAR LAMINECTOMY/DECOMPRESSION MICRODISCECTOMY N/A 05/28/2015   Procedure: L3-S1 HEMILAMINOTOMY, DECOMPRESSION, DISCECTOMY  (3  LEVELS);  Surgeon: Melina Schools, MD;  Location: [place];  Service: Orthopedics;  Laterality: N/A;    Family History  Problem Relation Age of Onset  . Heart attack Father     82 years  . Heart attack Maternal Grandmother   .  Heart attack Paternal Grandmother       Medication List       Accurate as of 11/01/16  9:54 AM. Always use your most recent med list.          atorvastatin 20 MG tablet Commonly known as:  LIPITOR Take 20 mg by mouth daily.   fenofibrate micronized 130 MG capsule Commonly known as:  ANTARA Take 1 capsule (130 mg total) by mouth daily.   lisinopril 10 MG tablet Commonly known as:  PRINIVIL,ZESTRIL Take 5 mg by mouth daily.   predniSONE 20 MG tablet Commonly known as:  DELTASONE 2 po at same time daily for 5 days            Objective:    BP 134/78   Pulse 92   Temp 97.3 F (36.3 C) (Oral)   Ht 5' 11.5" (1.816 m)   Wt 224 lb (101.6 kg)   BMI 30.81 kg/m   Wt Readings from Last 3 Encounters:  11/01/16 224 lb (101.6 kg)  05/28/15 210 lb (95.3 kg)    Physical Exam  Constitutional: He is oriented to person, place, and time. He appears well-developed and well-nourished. No distress.  Eyes: Conjunctivae are normal. Right eye exhibits no discharge. Left eye exhibits no discharge. No scleral icterus.  Cardiovascular: Normal rate, regular rhythm, normal heart sounds and intact distal pulses.   No murmur heard. Pulmonary/Chest: Effort normal and breath sounds normal. No respiratory distress. He has no wheezes. He has no rales.  Musculoskeletal: Normal range of motion. He exhibits no edema.       Right elbow: He exhibits normal range of motion, no swelling and no deformity. Tenderness found. Lateral epicondyle tenderness noted.       Cervical back: He exhibits normal range of motion, no tenderness, no bony tenderness, no swelling, no spasm and normal pulse.  Neurological: He is alert and oriented to person, place, and time. Coordination normal.  Skin: Skin is warm and dry. No rash noted. He is not diaphoretic.  Psychiatric: He has a normal mood and affect. His behavior is normal.  Nursing note and vitals reviewed.     Assessment & Plan:   Problem List Items Addressed This Visit      Cardiovascular and Mediastinum   Essential hypertension, benign   Relevant Medications   fenofibrate micronized (ANTARA) 130 MG capsule   Other Relevant Orders   CMP14+EGFR     Other   Hyperlipidemia LDL goal <130 - Primary   Relevant Medications   fenofibrate micronized (ANTARA) 130 MG capsule   Other Relevant Orders   Lipid panel    Other Visit Diagnoses    Lateral epicondylitis of right elbow       Relevant Medications   predniSONE (DELTASONE) 20 MG tablet       Follow up plan: Return in about 6 months (around  05/01/2017), or if symptoms worsen or fail to improve, for hyperlipidemia.  Caryl Pina, MD [facility] 11/01/2016, 9:54 AM

## 2016-11-02 LAB — CMP14+EGFR
A/G RATIO: 1.8 (ref 1.2–2.2)
ALT: 50 IU/L — ABNORMAL HIGH (ref 0–44)
AST: 25 IU/L (ref 0–40)
Albumin: 4.7 g/dL (ref 3.5–5.5)
Alkaline Phosphatase: 54 IU/L (ref 39–117)
BUN/Creatinine Ratio: 14 (ref 9–20)
BUN: 14 mg/dL (ref 6–20)
Bilirubin Total: 0.4 mg/dL (ref 0.0–1.2)
CALCIUM: 9.8 mg/dL (ref 8.7–10.2)
CO2: 25 mmol/L (ref 18–29)
CREATININE: 1.01 mg/dL (ref 0.76–1.27)
Chloride: 101 mmol/L (ref 96–106)
GFR calc Af Amer: 111 mL/min/{1.73_m2} (ref >59)
GFR, EST NON AFRICAN AMERICAN: 96 mL/min/{1.73_m2} (ref >59)
GLOBULIN, TOTAL: 2.6 g/dL (ref 1.5–4.5)
Glucose: 103 mg/dL — ABNORMAL HIGH (ref 65–99)
Potassium: 5.2 mmol/L (ref 3.5–5.2)
SODIUM: 141 mmol/L (ref 134–144)
TOTAL PROTEIN: 7.3 g/dL (ref 6.0–8.5)

## 2016-11-02 LAB — LIPID PANEL
Chol/HDL Ratio: 4.4 ratio units (ref 0.0–5.0)
Cholesterol, Total: 144 mg/dL (ref 100–199)
HDL: 33 mg/dL — AB (ref >39)
LDL CALC: 89 mg/dL (ref 0–99)
TRIGLYCERIDES: 109 mg/dL (ref 0–149)
VLDL Cholesterol Cal: 22 mg/dL (ref 5–40)

## 2016-11-03 NOTE — Addendum Note (Signed)
Addended by: Lorelee CoverOSTOSKY, Brittiany Wiehe C on: 11/03/2016 10:46 AM   Modules accepted: Orders

## 2016-11-11 ENCOUNTER — Other Ambulatory Visit: Payer: Self-pay | Admitting: Family Medicine

## 2016-12-02 ENCOUNTER — Encounter: Payer: Self-pay | Admitting: *Deleted

## 2016-12-17 ENCOUNTER — Other Ambulatory Visit: Payer: Self-pay | Admitting: Family Medicine

## 2017-03-17 ENCOUNTER — Other Ambulatory Visit: Payer: Self-pay | Admitting: Family Medicine

## 2017-05-26 ENCOUNTER — Encounter: Payer: Self-pay | Admitting: Family Medicine

## 2017-05-26 ENCOUNTER — Encounter: Payer: Self-pay | Admitting: *Deleted

## 2017-05-26 ENCOUNTER — Ambulatory Visit (INDEPENDENT_AMBULATORY_CARE_PROVIDER_SITE_OTHER): Payer: BLUE CROSS/BLUE SHIELD | Admitting: Family Medicine

## 2017-05-26 VITALS — BP 117/73 | HR 83 | Temp 97.1°F | Ht 71.5 in | Wt 214.4 lb

## 2017-05-26 DIAGNOSIS — E785 Hyperlipidemia, unspecified: Secondary | ICD-10-CM

## 2017-05-26 DIAGNOSIS — I10 Essential (primary) hypertension: Secondary | ICD-10-CM

## 2017-05-26 MED ORDER — LISINOPRIL 10 MG PO TABS: 10.0000 mg | ORAL_TABLET | Freq: Every day | ORAL | 2 refills | Status: DC

## 2017-05-26 MED ORDER — ATORVASTATIN CALCIUM 20 MG PO TABS: 20.0000 mg | ORAL_TABLET | Freq: Every day | ORAL | 2 refills | Status: DC

## 2017-05-26 MED ORDER — FENOFIBRATE MICRONIZED 130 MG PO CAPS: 130.0000 mg | ORAL_CAPSULE | Freq: Every day | ORAL | 2 refills | Status: DC

## 2017-05-26 NOTE — Progress Notes (Signed)
BP 117/73   Pulse 83   Temp 97.1 F (36.2 C) (Oral)   Ht 5' 11.5" (1.816 m)   Wt 214 lb 6 oz (97.2 kg)   BMI 29.48 kg/m    Subjective:    Patient ID: Thomas York, male    DOB: [birth year], 36 y.o.   MRN: [id number]  HPI: Thomas York is a 36 y.o. male presenting on 05/26/2017 for Hyperlipidemia (followup) and Hypertension   HPI Hyperlipidemia Patient is coming in for recheck of his hyperlipidemia. He is currently taking lipitor. He denies any issues with myalgias or history of liver damage from it. He denies any focal numbness or weakness or chest pain.   Hypertension Patient is currently on lisinopril, and her blood pressure today is 117/73. Patient denies any lightheadedness or dizziness. Patient denies headaches, blurred vision, chest pains, shortness of breath, or weakness. Denies any side effects from medication and is content with current medication.   Relevant past medical, surgical, family and social history reviewed and updated as indicated. Interim medical history since our last visit reviewed. Allergies and medications reviewed and updated.  Review of Systems  Constitutional: Negative for chills and fever.  Eyes: Negative for discharge.  Respiratory: Negative for shortness of breath and wheezing.   Cardiovascular: Negative for chest pain and leg swelling.  Musculoskeletal: Negative for back pain and gait problem.  Skin: Negative for rash.  Neurological: Negative for dizziness, seizures and weakness.  All other systems reviewed and are negative.  Per HPI unless specifically indicated above     Objective:    BP 117/73   Pulse 83   Temp 97.1 F (36.2 C) (Oral)   Ht 5' 11.5" (1.816 m)   Wt 214 lb 6 oz (97.2 kg)   BMI 29.48 kg/m   Wt Readings from Last 3 Encounters:  05/26/17 214 lb 6 oz (97.2 kg)  11/01/16 224 lb (101.6 kg)  05/28/15 210 lb (95.3 kg)    Physical Exam  Constitutional: He is oriented to person, place, and time. He appears  well-developed and well-nourished. No distress.  Eyes: Conjunctivae are normal. No scleral icterus.  Neck: Neck supple. No thyromegaly present.  Cardiovascular: Normal rate, regular rhythm, normal heart sounds and intact distal pulses.   No murmur heard. Pulmonary/Chest: Effort normal and breath sounds normal. No respiratory distress. He has no wheezes. He has no rales.  Musculoskeletal: Normal range of motion. He exhibits no edema.  Lymphadenopathy:    He has no cervical adenopathy.  Neurological: He is alert and oriented to person, place, and time. Coordination normal.  Skin: Skin is warm and dry. No rash noted. He is not diaphoretic.  Psychiatric: He has a normal mood and affect. His behavior is normal.  Nursing note and vitals reviewed.     Assessment & Plan:   Problem List Items Addressed This Visit      Cardiovascular and Mediastinum   Essential hypertension, benign   Relevant Medications   lisinopril (PRINIVIL,ZESTRIL) 10 MG tablet   fenofibrate micronized (ANTARA) 130 MG capsule   atorvastatin (LIPITOR) 20 MG tablet   Other Relevant Orders   CMP14+EGFR     Other   Hyperlipidemia LDL goal <130 - Primary   Relevant Medications   lisinopril (PRINIVIL,ZESTRIL) 10 MG tablet   fenofibrate micronized (ANTARA) 130 MG capsule   atorvastatin (LIPITOR) 20 MG tablet   Other Relevant Orders   Lipid panel       Follow up plan: Return in about 6  months (around 11/25/2017), or if symptoms worsen or fail to improve, for Annual physical and cholesterol recheck.  Counseling provided for all of the vaccine components Orders Placed This Encounter  Procedures  . CMP14+EGFR  . Lipid panel    Caryl Pina, MD [facility] 05/26/2017, 11:19 AM

## 2017-05-27 LAB — LIPID PANEL
CHOLESTEROL TOTAL: 142 mg/dL (ref 100–199)
Chol/HDL Ratio: 4.3 ratio (ref 0.0–5.0)
HDL: 33 mg/dL — ABNORMAL LOW (ref >39)
LDL CALC: 91 mg/dL (ref 0–99)
TRIGLYCERIDES: 92 mg/dL (ref 0–149)
VLDL CHOLESTEROL CAL: 18 mg/dL (ref 5–40)

## 2017-05-27 LAB — CMP14+EGFR
ALK PHOS: 48 IU/L (ref 39–117)
ALT: 45 IU/L — ABNORMAL HIGH (ref 0–44)
AST: 28 IU/L (ref 0–40)
Albumin/Globulin Ratio: 2.1 (ref 1.2–2.2)
Albumin: 5 g/dL (ref 3.5–5.5)
BUN/Creatinine Ratio: 18 (ref 9–20)
BUN: 19 mg/dL (ref 6–20)
Bilirubin Total: 0.5 mg/dL (ref 0.0–1.2)
CO2: 23 mmol/L (ref 20–29)
CREATININE: 1.05 mg/dL (ref 0.76–1.27)
Calcium: 9.7 mg/dL (ref 8.7–10.2)
Chloride: 99 mmol/L (ref 96–106)
GFR calc Af Amer: 106 mL/min/{1.73_m2} (ref >59)
GFR calc non Af Amer: 92 mL/min/{1.73_m2} (ref >59)
Globulin, Total: 2.4 g/dL (ref 1.5–4.5)
Glucose: 91 mg/dL (ref 65–99)
Potassium: 4.7 mmol/L (ref 3.5–5.2)
Sodium: 139 mmol/L (ref 134–144)
Total Protein: 7.4 g/dL (ref 6.0–8.5)

## 2017-10-21 ENCOUNTER — Encounter: Payer: Self-pay | Admitting: Family Medicine

## 2017-10-21 ENCOUNTER — Ambulatory Visit: Payer: BLUE CROSS/BLUE SHIELD | Admitting: Family Medicine

## 2017-10-21 VITALS — BP 138/77 | HR 66 | Temp 97.9°F | Ht 71.5 in | Wt 223.0 lb

## 2017-10-21 DIAGNOSIS — Z0000 Encounter for general adult medical examination without abnormal findings: Secondary | ICD-10-CM | POA: Diagnosis not present

## 2017-10-21 DIAGNOSIS — E785 Hyperlipidemia, unspecified: Secondary | ICD-10-CM

## 2017-10-21 DIAGNOSIS — I10 Essential (primary) hypertension: Secondary | ICD-10-CM

## 2017-10-21 MED ORDER — FENOFIBRATE MICRONIZED 43 MG PO CAPS: 130.0000 mg | ORAL_CAPSULE | Freq: Every day | ORAL | 3 refills | Status: DC

## 2017-10-21 MED ORDER — LISINOPRIL 10 MG PO TABS: 10.0000 mg | ORAL_TABLET | Freq: Every day | ORAL | 3 refills | Status: DC

## 2017-10-21 MED ORDER — ATORVASTATIN CALCIUM 20 MG PO TABS: 20.0000 mg | ORAL_TABLET | Freq: Every day | ORAL | 3 refills | Status: DC

## 2017-10-21 NOTE — Progress Notes (Signed)
BP 138/77   Pulse 66   Temp 97.9 F (36.6 C) (Oral)   Ht 5' 11.5" (1.816 m)   Wt 223 lb (101.2 kg)   BMI 30.67 kg/m    Subjective:    Patient ID: Thomas York, male    DOB: [birth year], 36 y.o.   MRN: [id number]  HPI: Thomas York is a 36 y.o. male presenting on 10/21/2017 for Hyperlipidemia (follow up) and Hypertension   HPI  Adult well exam Is coming in today for adult well exam and recheck on blood pressure and cholesterol.  His cholesterol was great last time and his blood pressure has been controlled.  He denies any side effects from his medication.  His liver function was creeping up just a bit so we will stop his fenofibrate because it is not needed because his triglycerides are less than 100. Patient denies any chest pain, shortness of breath, headaches or vision issues, abdominal complaints, diarrhea, nausea, vomiting, or joint issues.   Relevant past medical, surgical, family and social history reviewed and updated as indicated. Interim medical history since our last visit reviewed. Allergies and medications reviewed and updated.  Review of Systems  Constitutional: Negative for chills and fever.  HENT: Negative for ear pain and tinnitus.   Eyes: Negative for pain.  Respiratory: Negative for cough, shortness of breath and wheezing.   Cardiovascular: Negative for chest pain, palpitations and leg swelling.  Gastrointestinal: Negative for abdominal pain, blood in stool, constipation and diarrhea.  Genitourinary: Negative for dysuria and hematuria.  Musculoskeletal: Negative for back pain and myalgias.  Skin: Negative for rash.  Neurological: Negative for dizziness, weakness and headaches.  Psychiatric/Behavioral: Negative for suicidal ideas.    Per HPI unless specifically indicated above        Objective:    BP 138/77   Pulse 66   Temp 97.9 F (36.6 C) (Oral)   Ht 5' 11.5" (1.816 m)   Wt 223 lb (101.2 kg)   BMI 30.67 kg/m   Wt Readings from Last 3  Encounters:  10/21/17 223 lb (101.2 kg)  05/26/17 214 lb 6 oz (97.2 kg)  11/01/16 224 lb (101.6 kg)    Physical Exam  Constitutional: He is oriented to person, place, and time. He appears well-developed and well-nourished. No distress.  HENT:  Right Ear: External ear normal.  Left Ear: External ear normal.  Nose: Nose normal.  Mouth/Throat: Oropharynx is clear and moist. No oropharyngeal exudate.  Eyes: Conjunctivae and EOM are normal. Pupils are equal, round, and reactive to light. No scleral icterus.  Neck: Neck supple. No thyromegaly present.  Cardiovascular: Normal rate, regular rhythm, normal heart sounds and intact distal pulses.  No murmur heard. Pulmonary/Chest: Effort normal and breath sounds normal. No respiratory distress. He has no wheezes.  Abdominal: Soft. Bowel sounds are normal. He exhibits no distension. There is no tenderness. There is no rebound and no guarding.  Musculoskeletal: Normal range of motion. He exhibits no edema.  Lymphadenopathy:    He has no cervical adenopathy.  Neurological: He is alert and oriented to person, place, and time. Coordination normal.  Skin: Skin is warm and dry. No rash noted. He is not diaphoretic.  Psychiatric: He has a normal mood and affect. His behavior is normal.  Vitals reviewed.       Assessment & Plan:   Problem List Items Addressed This Visit      Cardiovascular and Mediastinum   Essential hypertension, benign   Relevant Medications  lisinopril (PRINIVIL,ZESTRIL) 10 MG tablet   atorvastatin (LIPITOR) 20 MG tablet   Other Relevant Orders   CMP14+EGFR     Other   Hyperlipidemia LDL goal <130   Relevant Medications   lisinopril (PRINIVIL,ZESTRIL) 10 MG tablet   atorvastatin (LIPITOR) 20 MG tablet   Other Relevant Orders   Lipid panel    Other Visit Diagnoses    Well adult exam    -  Primary   Relevant Orders   CMP14+EGFR   Lipid panel      Will discontinue the fenofibrate because triglycerides have been  very low  Follow up plan: Return in about 1 year (around 10/21/2018), or if symptoms worsen or fail to improve, for Well physical and labs.  Counseling provided for all of the vaccine components Orders Placed This Encounter  Procedures  . CMP14+EGFR  . Lipid panel    Caryl Pina, MD [facility] 10/21/2017, 1:57 PM

## 2017-10-22 LAB — CMP14+EGFR
A/G RATIO: 1.9 (ref 1.2–2.2)
ALBUMIN: 4.5 g/dL (ref 3.5–5.5)
ALK PHOS: 48 IU/L (ref 39–117)
ALT: 48 IU/L — ABNORMAL HIGH (ref 0–44)
AST: 28 IU/L (ref 0–40)
BUN / CREAT RATIO: 14 (ref 9–20)
BUN: 16 mg/dL (ref 6–20)
Bilirubin Total: 0.3 mg/dL (ref 0.0–1.2)
CALCIUM: 9.3 mg/dL (ref 8.7–10.2)
CO2: 25 mmol/L (ref 20–29)
CREATININE: 1.11 mg/dL (ref 0.76–1.27)
Chloride: 100 mmol/L (ref 96–106)
GFR calc Af Amer: 98 mL/min/{1.73_m2} (ref >59)
GFR, EST NON AFRICAN AMERICAN: 85 mL/min/{1.73_m2} (ref >59)
GLOBULIN, TOTAL: 2.4 g/dL (ref 1.5–4.5)
Glucose: 90 mg/dL (ref 65–99)
POTASSIUM: 4.3 mmol/L (ref 3.5–5.2)
SODIUM: 140 mmol/L (ref 134–144)
Total Protein: 6.9 g/dL (ref 6.0–8.5)

## 2017-10-22 LAB — LIPID PANEL
CHOL/HDL RATIO: 4.6 ratio (ref 0.0–5.0)
Cholesterol, Total: 134 mg/dL (ref 100–199)
HDL: 29 mg/dL — ABNORMAL LOW (ref >39)
LDL CALC: 89 mg/dL (ref 0–99)
Triglycerides: 81 mg/dL (ref 0–149)
VLDL Cholesterol Cal: 16 mg/dL (ref 5–40)

## 2018-04-03 ENCOUNTER — Other Ambulatory Visit: Payer: Self-pay | Admitting: Family Medicine

## 2018-04-03 DIAGNOSIS — I10 Essential (primary) hypertension: Secondary | ICD-10-CM

## 2018-04-03 NOTE — Telephone Encounter (Signed)
What is the name of the medication? lisinopril  Have you contacted your pharmacy to request a refill? yes  Which pharmacy would you like this sent to? [facility]   Patient notified that their request is being sent to the clinical staff for review and that they should receive a call once it is complete. If they do not receive a call within 24 hours they can check with their pharmacy or our office.

## 2018-04-04 NOTE — Telephone Encounter (Signed)
LMOVM that a years refill was sent in at his 10/21/17 visit

## 2019-01-23 ENCOUNTER — Other Ambulatory Visit: Payer: Self-pay | Admitting: Family Medicine

## 2019-01-23 DIAGNOSIS — I10 Essential (primary) hypertension: Secondary | ICD-10-CM

## 2019-01-25 ENCOUNTER — Ambulatory Visit: Payer: Commercial Managed Care - PPO | Admitting: Family Medicine

## 2019-01-25 ENCOUNTER — Encounter: Payer: Self-pay | Admitting: Family Medicine

## 2019-01-25 VITALS — BP 132/77 | HR 73 | Temp 97.3°F | Ht 71.5 in | Wt 221.4 lb

## 2019-01-25 DIAGNOSIS — I10 Essential (primary) hypertension: Secondary | ICD-10-CM | POA: Diagnosis not present

## 2019-01-25 DIAGNOSIS — E669 Obesity, unspecified: Secondary | ICD-10-CM | POA: Diagnosis not present

## 2019-01-25 DIAGNOSIS — Z0000 Encounter for general adult medical examination without abnormal findings: Secondary | ICD-10-CM

## 2019-01-25 DIAGNOSIS — E785 Hyperlipidemia, unspecified: Secondary | ICD-10-CM

## 2019-01-25 DIAGNOSIS — Z0001 Encounter for general adult medical examination with abnormal findings: Secondary | ICD-10-CM

## 2019-01-25 DIAGNOSIS — E663 Overweight: Secondary | ICD-10-CM | POA: Insufficient documentation

## 2019-01-25 MED ORDER — LISINOPRIL 10 MG PO TABS: 10.0000 mg | ORAL_TABLET | Freq: Every day | ORAL | 3 refills | Status: DC

## 2019-01-25 MED ORDER — ATORVASTATIN CALCIUM 20 MG PO TABS: 20.0000 mg | ORAL_TABLET | Freq: Every day | ORAL | 3 refills | Status: DC

## 2019-01-25 NOTE — Progress Notes (Signed)
BP 132/77   Pulse 73   Temp (!) 97.3 F (36.3 C) (Oral)   Ht 5' 11.5" (1.816 m)   Wt 221 lb 6.4 oz (100.4 kg)   BMI 30.45 kg/m    Subjective:    Patient ID: LAYTHAN HAYTER, male    DOB: [birth year], 38 y.o.   MRN: [id number]  HPI: JONATHAN CORPUS is a 38 y.o. male presenting on 01/25/2019 for Hypertension (check up of chronic medical problems) and Hyperlipidemia   HPI  Patient is coming in today for adult well exam and physical and recheck of chronic issues.  He says that he is not having issues except that he did have the flu couple weeks ago and still has a little bit of congestion but other than that he is doing very well.  He denies any major issues with his medications although he does admit that he has been not taking his cholesterol pill over the past 2 months so we will check where it is today with his blood work  Hypertension Patient is currently on lisinopril, and their blood pressure today is 132/77. Patient denies any lightheadedness or dizziness. Patient denies headaches, blurred vision, chest pains, shortness of breath, or weakness. Denies any side effects from medication and is content with current medication.   Hyperlipidemia Patient is coming in for recheck of his hyperlipidemia. The patient is currently taking Lipitor. They deny any issues with myalgias or history of liver damage from it. They deny any focal numbness or weakness or chest pain.   Relevant past medical, surgical, family and social history reviewed and updated as indicated. Interim medical history since our last visit reviewed. Allergies and medications reviewed and updated.  Review of Systems  Constitutional: Negative for chills and fever.  HENT: Positive for congestion. Negative for ear pain and tinnitus.   Eyes: Negative for pain.  Respiratory: Negative for cough, shortness of breath and wheezing.   Cardiovascular: Negative for chest pain, palpitations and leg swelling.  Gastrointestinal: Negative  for abdominal pain, blood in stool, constipation and diarrhea.  Genitourinary: Negative for dysuria and hematuria.  Musculoskeletal: Negative for back pain and myalgias.  Skin: Negative for rash.  Neurological: Negative for dizziness, weakness and headaches.  Psychiatric/Behavioral: Negative for suicidal ideas.    Per HPI unless specifically indicated above   Allergies as of 01/25/2019      Reactions   Penicillins Hives, Swelling      Medication List       Accurate as of January 25, 2019  2:21 PM. Always use your most recent med list.        atorvastatin 20 MG tablet Commonly known as:  LIPITOR Take 1 tablet (20 mg total) by mouth daily.   lisinopril 10 MG tablet Commonly known as:  PRINIVIL,ZESTRIL Take 1 tablet (10 mg total) by mouth daily.          Objective:    BP 132/77   Pulse 73   Temp (!) 97.3 F (36.3 C) (Oral)   Ht 5' 11.5" (1.816 m)   Wt 221 lb 6.4 oz (100.4 kg)   BMI 30.45 kg/m   Wt Readings from Last 3 Encounters:  01/25/19 221 lb 6.4 oz (100.4 kg)  10/21/17 223 lb (101.2 kg)  05/26/17 214 lb 6 oz (97.2 kg)    Physical Exam Vitals signs and nursing note reviewed.  Constitutional:      General: He is not in acute distress.    Appearance: He is well-developed.  He is not diaphoretic.  Eyes:     General: No scleral icterus.       Right eye: No discharge.     Conjunctiva/sclera: Conjunctivae normal.     Pupils: Pupils are equal, round, and reactive to light.  Neck:     Musculoskeletal: Neck supple.     Thyroid: No thyromegaly.  Cardiovascular:     Rate and Rhythm: Normal rate and regular rhythm.     Heart sounds: Normal heart sounds. No murmur.  Pulmonary:     Effort: Pulmonary effort is normal. No respiratory distress.     Breath sounds: Normal breath sounds. No wheezing.  Abdominal:     General: Abdomen is flat. Bowel sounds are normal. There is no distension.     Tenderness: There is no abdominal tenderness.  Musculoskeletal: Normal  range of motion.  Lymphadenopathy:     Cervical: No cervical adenopathy.  Skin:    General: Skin is warm and dry.     Findings: No rash.  Neurological:     Mental Status: He is alert and oriented to person, place, and time.     Coordination: Coordination normal.  Psychiatric:        Behavior: Behavior normal.       Assessment & Plan:   Problem List Items Addressed This Visit      Cardiovascular and Mediastinum   Essential hypertension, benign   Relevant Medications   lisinopril (PRINIVIL,ZESTRIL) 10 MG tablet   atorvastatin (LIPITOR) 20 MG tablet   Other Relevant Orders   CBC with Differential/Platelet   CMP14+EGFR   Lipid panel     Other   Hyperlipidemia LDL goal <130   Relevant Medications   lisinopril (PRINIVIL,ZESTRIL) 10 MG tablet   atorvastatin (LIPITOR) 20 MG tablet   Other Relevant Orders   CBC with Differential/Platelet   CMP14+EGFR   Lipid panel   Obesity (BMI 30.0-34.9)   Relevant Orders   CBC with Differential/Platelet   CMP14+EGFR   Lipid panel    Other Visit Diagnoses    Well adult exam    -  Primary       Follow up plan: Return in about 1 year (around 01/26/2020), or if symptoms worsen or fail to improve, for Hypertension cholesterol recheck.  Counseling provided for all of the vaccine components Orders Placed This Encounter  Procedures  . CBC with Differential/Platelet  . CMP14+EGFR  . Lipid panel    Caryl Pina, MD [facility] 01/25/2019, 2:21 PM

## 2019-01-26 LAB — CMP14+EGFR
ALBUMIN: 4.5 g/dL (ref 4.0–5.0)
ALK PHOS: 93 IU/L (ref 39–117)
ALT: 40 IU/L (ref 0–44)
AST: 19 IU/L (ref 0–40)
Albumin/Globulin Ratio: 2 (ref 1.2–2.2)
BUN / CREAT RATIO: 11 (ref 9–20)
BUN: 11 mg/dL (ref 6–20)
Bilirubin Total: <0.2 mg/dL (ref 0.0–1.2)
CALCIUM: 9.6 mg/dL (ref 8.7–10.2)
CO2: 25 mmol/L (ref 20–29)
CREATININE: 0.96 mg/dL (ref 0.76–1.27)
Chloride: 99 mmol/L (ref 96–106)
GFR calc Af Amer: 116 mL/min/{1.73_m2} (ref >59)
GFR calc non Af Amer: 101 mL/min/{1.73_m2} (ref >59)
GLUCOSE: 104 mg/dL — AB (ref 65–99)
Globulin, Total: 2.2 g/dL (ref 1.5–4.5)
Potassium: 4.2 mmol/L (ref 3.5–5.2)
Sodium: 139 mmol/L (ref 134–144)
Total Protein: 6.7 g/dL (ref 6.0–8.5)

## 2019-01-26 LAB — CBC WITH DIFFERENTIAL/PLATELET
BASOS ABS: 0.1 10*3/uL (ref 0.0–0.2)
Basos: 1 %
EOS (ABSOLUTE): 0.3 10*3/uL (ref 0.0–0.4)
Eos: 3 %
HEMOGLOBIN: 16.9 g/dL (ref 13.0–17.7)
Hematocrit: 47.4 % (ref 37.5–51.0)
IMMATURE GRANS (ABS): 0.1 10*3/uL (ref 0.0–0.1)
IMMATURE GRANULOCYTES: 1 %
LYMPHS ABS: 3.1 10*3/uL (ref 0.7–3.1)
LYMPHS: 35 %
MCH: 30.7 pg (ref 26.6–33.0)
MCHC: 35.7 g/dL (ref 31.5–35.7)
MCV: 86 fL (ref 79–97)
MONOCYTES: 8 %
Monocytes Absolute: 0.7 10*3/uL (ref 0.1–0.9)
NEUTROS PCT: 52 %
Neutrophils Absolute: 4.7 10*3/uL (ref 1.4–7.0)
Platelets: 265 10*3/uL (ref 150–450)
RBC: 5.51 x10E6/uL (ref 4.14–5.80)
RDW: 12.8 % (ref 11.6–15.4)
WBC: 8.9 10*3/uL (ref 3.4–10.8)

## 2019-01-26 LAB — LIPID PANEL
CHOLESTEROL TOTAL: 257 mg/dL — AB (ref 100–199)
Chol/HDL Ratio: 9.5 ratio — ABNORMAL HIGH (ref 0.0–5.0)
HDL: 27 mg/dL — ABNORMAL LOW (ref >39)
TRIGLYCERIDES: 754 mg/dL — AB (ref 0–149)

## 2019-10-26 ENCOUNTER — Other Ambulatory Visit: Payer: Self-pay

## 2019-10-26 ENCOUNTER — Encounter: Payer: Self-pay | Admitting: Family Medicine

## 2019-10-26 ENCOUNTER — Encounter (HOSPITAL_COMMUNITY): Payer: Self-pay | Admitting: Emergency Medicine

## 2019-10-26 ENCOUNTER — Telehealth: Payer: Self-pay | Admitting: *Deleted

## 2019-10-26 ENCOUNTER — Emergency Department (HOSPITAL_COMMUNITY)
Admission: EM | Admit: 2019-10-26 | Discharge: 2019-10-27 | Disposition: A | Discharge status: Home / Self Care | Payer: Commercial Managed Care - PPO | Attending: Emergency Medicine | Admitting: Emergency Medicine

## 2019-10-26 ENCOUNTER — Ambulatory Visit: Payer: Commercial Managed Care - PPO | Admitting: Family Medicine

## 2019-10-26 VITALS — BP 157/93 | HR 80 | Temp 98.7°F | Ht 71.0 in | Wt 216.0 lb

## 2019-10-26 DIAGNOSIS — Z79899 Other long term (current) drug therapy: Secondary | ICD-10-CM | POA: Insufficient documentation

## 2019-10-26 DIAGNOSIS — I10 Essential (primary) hypertension: Secondary | ICD-10-CM | POA: Diagnosis not present

## 2019-10-26 DIAGNOSIS — M79604 Pain in right leg: Secondary | ICD-10-CM | POA: Diagnosis present

## 2019-10-26 MED ORDER — METHYLPREDNISOLONE ACETATE 80 MG/ML IJ SUSP
80.0000 mg | Freq: Once | INTRAMUSCULAR | Status: AC
  Administered 2019-10-26: 14:00:00 80 mg via INTRAMUSCULAR

## 2019-10-26 MED ORDER — KETOROLAC TROMETHAMINE 60 MG/2ML IM SOLN
30.0000 mg | Freq: Once | INTRAMUSCULAR | Status: DC
  Filled 2019-10-26: qty 2

## 2019-10-26 MED ORDER — IBUPROFEN 800 MG PO TABS: 400.0000 mg | ORAL_TABLET | Freq: Three times a day (TID) | ORAL | 0 refills | Status: AC

## 2019-10-26 MED ORDER — HYDROMORPHONE HCL 1 MG/ML IJ SOLN
1.0000 mg | Freq: Once | INTRAMUSCULAR | Status: DC
  Filled 2019-10-26: qty 1

## 2019-10-26 MED ORDER — PREDNISONE 10 MG (21) PO TBPK: ORAL_TABLET | ORAL | 0 refills | Status: DC

## 2019-10-26 NOTE — Progress Notes (Signed)
Assessment & Plan:  1. Acute leg pain, right -Patient declined a venous duplex to rule out DVT.  We did discuss the risk of doing so.  He was given a shot of methylprednisolone in the office and a prednisone taper was also sent to his pharmacy.  Education provided on hamstring strain exercises. - predniSONE (STERAPRED UNI-PAK 21 TAB) 10 MG (21) TBPK tablet; Use as directed on back of pill pack  Dispense: 21 tablet; Refill: 0 - methylPREDNISolone acetate (DEPO-MEDROL) injection 80 mg   Follow up plan: Return if symptoms worsen or fail to improve.  Deliah Boston, MSN, APRN, FNP-C [facility]  Subjective:   Patient ID: Thomas York, male    DOB: 1981/05/14, 38 y.o.   MRN: [id number]  HPI: Thomas York is a 38 y.o. male presenting on 10/26/2019 for Leg Pain  Patient complains of pain in the backside of his right upper leg.  He reports he has had a similar type pain in the past before he had his back surgery.  He describes the pain as sharp and shooting down his leg from his thigh area, not his back.  He rates the pain 8 out of 10 when walking or standing.  Pain is relieved when he is lying down.  This pain just started in the middle of the night.  He woke up to go use the restroom and when he stood was when he felt the pain.  He denies any injury or overuse of these muscles.  He denies back or hip pain.  He has tried taking ibuprofen 800 mg twice today and is not getting relief from the pain.  He does do stretches every morning due to stiffness.   ROS: Negative unless specifically indicated above in HPI.   Relevant past medical history reviewed and updated as indicated.   Allergies and medications reviewed and updated.   Current Outpatient Medications:  .  atorvastatin (LIPITOR) 20 MG tablet, Take 1 tablet (20 mg total) by mouth daily., Disp: 90 tablet, Rfl: 3 .  lisinopril (PRINIVIL,ZESTRIL) 10 MG tablet, Take 1 tablet (10 mg total) by mouth daily., Disp: 90  tablet, Rfl: 3 .  ibuprofen (ADVIL) 800 MG tablet, Take 0.5 tablets (400 mg total) by mouth 3 (three) times daily for 7 days., Disp: 30 tablet, Rfl: 0 .  predniSONE (STERAPRED UNI-PAK 21 TAB) 10 MG (21) TBPK tablet, Use as directed on back of pill pack, Disp: 21 tablet, Rfl: 0  Allergies  Allergen Reactions  . Penicillins Hives and Swelling    Objective:   BP (!) 157/93   Pulse 80   Temp 98.7 F (37.1 C) (Temporal)   Ht 5\' 11"  (1.803 m)   Wt 216 lb (98 kg)   SpO2 98%   BMI 30.13 kg/m    Physical Exam Vitals signs reviewed.  Constitutional:      General: He is not in acute distress.    Appearance: Normal appearance. He is obese. He is not ill-appearing, toxic-appearing or diaphoretic.  HENT:     Head: Normocephalic and atraumatic.  Eyes:     General: No scleral icterus.       Right eye: No discharge.        Left eye: No discharge.     Conjunctiva/sclera: Conjunctivae normal.  Neck:     Musculoskeletal: Normal range of motion.  Cardiovascular:     Rate and Rhythm: Normal rate.  Pulmonary:     Effort: Pulmonary effort is normal.  No respiratory distress.  Musculoskeletal: Normal range of motion.     Comments: There is mild erythema and swelling to the backside of the patient's right upper leg.  I am unable to reproduce pain with manipulation of the leg.  It is tender to the touch.  Skin:    General: Skin is warm and dry.  Neurological:     Mental Status: He is alert and oriented to person, place, and time. Mental status is at baseline.  Psychiatric:        Mood and Affect: Mood normal.        Behavior: Behavior normal.        Thought Content: Thought content normal.        Judgment: Judgment normal.

## 2019-10-26 NOTE — Telephone Encounter (Signed)
Steroids will help better than any pain medication for this issues which he received as an injection and oral taper. Exercises provided to help as well. May try Ibuprofen at the same time as Tylenol 500 mg every 6-8 hours. These medications work different pathways and therefore give a greater pain relief when taken together.

## 2019-10-26 NOTE — ED Triage Notes (Addendum)
Pt states he is having right leg pain since yesterday. Pt states there is a knot to the back of his right thigh. Pt saw his PCP today, he states they gave him a shot but it didn't help anything.

## 2019-10-26 NOTE — Patient Instructions (Signed)
Hamstring Strain Rehab Ask your health care provider which exercises are safe for you. Do exercises exactly as told by your health care provider and adjust them as directed. It is normal to feel mild stretching, pulling, tightness, or discomfort as you do these exercises. Stop right away if you feel sudden pain or your pain gets worse. Do not begin these exercises until told by your health care provider. Stretching and range-of-motion exercises These exercises warm up your muscles and joints and improve the movement and flexibility of your thighs. These exercises also help to relieve pain, numbness, and tingling. Talk to your health care provider about these restrictions. Knee extension, seated  1. Sit with your left / right heel propped on a chair, a coffee table, or a footstool. Do not have anything under your knee to support it. 2. Allow your leg muscles to relax, letting gravity straighten out your knee (extension). You should feel a stretch behind your left / right knee. 3. If told by your health care provider, deepen the stretch by placing a __________ weight on your thigh, just above your kneecap. 4. Hold this position for __________ seconds. Repeat __________ times. Complete this exercise __________ times a day. Seated stretch This exercise is sometimes called hamstrings and adductors stretch. 1. Sit on the floor with your legs stretched wide. Keep your knees straight during this exercise. 2. Keeping your head and back in a straight line, bend at your waist to reach for your left foot (position A). You should feel a stretch in your right inner thigh (adductors). 3. Hold this position for __________ seconds. Then slowly return to the upright position. 4. Keeping your head and back in a straight line, bend at your waist to reach forward (position B). You should feel a stretch behind both of your thighs or knees (hamstrings). 5. Hold this position for __________ seconds. Then slowly return to  the upright position. 6. Keeping your head and back in a straight line, bend at your waist to reach for your right foot (position C). You should feel a stretch in your left inner thigh (adductors). 7. Hold this position for __________ seconds. Then slowly return to the upright position. Repeat __________ times. Complete this exercise __________ times a day. Hamstrings stretch, supine  1. Lie on your back (supine position). 2. Loop a belt or towel over the ball of your left / right foot. The ball of your foot is on the walking surface, right under your toes. 3. Straighten your left / right knee and slowly pull on the belt or towel to raise your leg. ? Do not let your left / right knee bend while you do this. ? Keep your other leg flat on the floor. ? Raise the left / right leg until you feel a gentle stretch behind your left / right knee or thigh (hamstrings). 4. Hold this position for __________ seconds. 5. Slowly return your leg to the starting position. Repeat __________ times. Complete this exercise __________ times a day. Strengthening exercises These exercises build strength and endurance in your thighs. Endurance is the ability to use your muscles for a long time, even after they get tired. Straight leg raises, prone This exercise strengthens the muscles that move the hips (hip extensors). 1. Lie on your abdomen on a firm surface (prone position). 2. Tense the muscles in your buttocks and lift your left / right leg about 4 inches (10 cm). Keep your knee straight as you lift your leg. If you   cannot lift your leg that high without arching your back, place a pillow under your hips. 3. Hold the position for __________ seconds. 4. Slowly lower your leg to the starting position. 5. Allow your muscles to relax completely before you start the next repetition. Repeat __________ times. Complete this exercise __________ times a day. Bridge This exercise strengthens the muscles in your buttocks  and the back of your thighs (hip extensors). 1. Lie on your back on a firm surface with your knees bent and your feet flat on the floor. 2. Tighten your buttocks muscles and lift your bottom off the floor until the trunk of your body is level with your thighs. ? You should feel the muscles working in your buttocks and the back of your thighs. ? Do not arch your back. 3. Hold this position for __________ seconds. 4. Slowly lower your hips to the starting position. 5. Let your buttocks muscles relax completely between repetitions. 6. If told by your health care provider, keep your bottom lifted off the floor while you slowly walk your feet away from you as far as you can control. Hold for __________ seconds, then slowly walk your feet back toward you. Repeat __________ times. Complete this exercise __________ times a day. Lateral walking with band This is an exercise in which you walk sideways (lateral), with tension provided by an exercise band. The exercise strengthens the muscles in your hip (hip abductors). 1. Stand in a long hallway. 2. Wrap a loop of exercise band around your legs, just above your knees. 3. Bend your knees gently and drop your hips down and back so your weight is over your heels. 4. Step to the side to move down the length of the hallway, keeping your toes pointed ahead of you and keeping tension in the band. 5. Repeat, leading with your other leg. Repeat __________ times. Complete this exercise __________ times a day. Single leg stand with reaching This exercise is also called eccentric hamstring stretch. 1. Stand on your left / right foot. Keep your big toe down on the floor and try to keep your arch lifted. 2. Slowly reach down toward the floor as far as you can while keeping your balance. Lowering your thigh under tension is called eccentric stretching. 3. Hold this position for __________ seconds. Repeat __________ times. Complete this exercise __________ times a day.  Plank, prone This exercise strengthens muscles in your abdomen and core area. 1. Lie on your abdomen on the floor (prone position),and prop yourself up on your elbows. Your hands should be straight out in front of you, and your elbows should be below your shoulders. Position your feet similar to a push-up position so your toes are on the ground. 2. Tighten your abdominal muscles and lift your body off the floor. ? Do not arch your back. ? Do not hold your breath. 3. Hold this position for __________ seconds. Repeat __________ times. Complete this exercise __________ times a day. This information is not intended to replace advice given to you by your health care provider. Make sure you discuss any questions you have with your health care provider. Document Released: 11/29/2005 Document Revised: 03/22/2019 Document Reviewed: 11/27/2018 [employer] Patient Education  2020 [employer]  

## 2019-10-26 NOTE — Telephone Encounter (Signed)
Pt would like something for the severe pain, Just to get him through the weekend.

## 2019-10-27 NOTE — ED Provider Notes (Signed)
[facility] EMERGENCY DEPARTMENT Provider Note   CSN: [id number] Arrival date & time: 10/26/19  1843     History   Chief Complaint Chief Complaint  Patient presents with  . Leg Pain    HPI Thomas York is a 38 y.o. male.     H/o sciatica s/p fusion, this feels similar. No trauma. No infectious symptoms.   Leg Pain Location:  Leg Leg location:  R upper leg Pain details:    Quality:  Aching and sharp   Radiates to:  Does not radiate   Severity:  Mild   Onset quality:  Gradual   Timing:  Constant Chronicity:  Recurrent Tetanus status:  Unknown Prior injury to area:  Yes Relieved by:  None tried Ineffective treatments:  None tried   Past Medical History:  Diagnosis Date  . Anxiety    takes Valium daily as needed  . Chronic back pain    HNP   . GERD (gastroesophageal reflux disease)    has taken Zantac daily  . History of kidney stones   . Hyperlipemia    takes Lipitor and Fenofibrate daily  . Hypertension    takes Lisinopril daily  . Insomnia    coming from being in pain  . Weakness    numbness and tingling down right leg    Patient Active Problem List   Diagnosis Date Noted  . Obesity (BMI 30.0-34.9) 01/25/2019  . Hyperlipidemia LDL goal <130 11/01/2016  . Essential hypertension, benign 11/01/2016  . Back pain 05/28/2015    Past Surgical History:  Procedure Laterality Date  . HERNIA REPAIR Right    inguinal   . LUMBAR LAMINECTOMY/DECOMPRESSION MICRODISCECTOMY N/A 05/28/2015   Procedure: L3-S1 HEMILAMINOTOMY, DECOMPRESSION, DISCECTOMY  (3  LEVELS);  Surgeon: Melina Schools, MD;  Location: [facility];  Service: Orthopedics;  Laterality: N/A;        Home Medications    Prior to Admission medications   Medication Sig Start Date End Date Taking? Authorizing Provider  atorvastatin (LIPITOR) 20 MG tablet Take 1 tablet (20 mg total) by mouth daily. 01/25/19   Dettinger, Fransisca Kaufmann, MD  ibuprofen (ADVIL) 800 MG tablet Take 0.5 tablets (400 mg total) by  mouth 3 (three) times daily for 7 days. 10/26/19 11/02/19  Creg Gilmer, Corene Cornea, MD  lisinopril (PRINIVIL,ZESTRIL) 10 MG tablet Take 1 tablet (10 mg total) by mouth daily. 01/25/19   Dettinger, Fransisca Kaufmann, MD  predniSONE (STERAPRED UNI-PAK 21 TAB) 10 MG (21) TBPK tablet Use as directed on back of pill pack 10/26/19   Loman Brooklyn, FNP    Family History Family History  Problem Relation Age of Onset  . Heart attack Father        50 years  . Heart attack Maternal Grandmother   . Heart attack Paternal Grandmother     Social History Social History   Tobacco Use  . Smoking status: Never Smoker  . Smokeless tobacco: Never Used  Substance Use Topics  . Alcohol use: Yes    Comment: rare  . Drug use: No     Allergies   Penicillins   Review of Systems Review of Systems  All other systems reviewed and are negative.    Physical Exam Updated Vital Signs BP (!) 175/95   Pulse 84   Temp 98.2 F (36.8 C) (Oral)   Resp 16   Ht 6' (1.829 m)   Wt 97.5 kg   SpO2 97%   BMI 29.16 kg/m   Physical Exam Vitals signs  and nursing note reviewed.  Constitutional:      Appearance: He is well-developed.  HENT:     Head: Normocephalic and atraumatic.  Eyes:     Conjunctiva/sclera: Conjunctivae normal.  Neck:     Musculoskeletal: Normal range of motion.  Cardiovascular:     Rate and Rhythm: Normal rate.  Pulmonary:     Effort: Pulmonary effort is normal. No respiratory distress.  Abdominal:     General: There is no distension.  Musculoskeletal: Normal range of motion.        General: No swelling or tenderness.  Skin:    General: Skin is warm and dry.  Neurological:     General: No focal deficit present.     Mental Status: He is alert.      ED Treatments / Results  Labs (all labs ordered are listed, but only abnormal results are displayed) Labs Reviewed - No data to display  EKG None  Radiology No results found.  Procedures Procedures (including critical care time)   Medications Ordered in ED Medications - No data to display   Initial Impression / Assessment and Plan / ED Course  I have reviewed the triage vital signs and the nursing notes.  Pertinent labs & imaging results that were available during my care of the patient were reviewed by me and considered in my medical decision making (see chart for details).  Ambulates well. H/o sciatica. Similar to previous. Patients doctor told him it was swollen, but doesn't appear so to me or him. Doubt dvt. Will tx for radiculopathy.   Final Clinical Impressions(s) / ED Diagnoses   Final diagnoses:  Right leg pain    ED Discharge Orders         Ordered    ibuprofen (ADVIL) 800 MG tablet  3 times daily     10/26/19 2331           Rosy Estabrook, Barbara Cower, MD 10/27/19 [phone]

## 2019-10-29 ENCOUNTER — Telehealth: Payer: Self-pay | Admitting: Family Medicine

## 2019-10-29 NOTE — Telephone Encounter (Signed)
Letter written and printed and pt aware ready for pick up.

## 2019-10-29 NOTE — Telephone Encounter (Signed)
Pt will need an appoint for anything "stronger "  Aware to call the office and set up a time

## 2019-10-29 NOTE — Telephone Encounter (Signed)
I am fine to extend work note until the 23rd

## 2019-10-30 ENCOUNTER — Ambulatory Visit (HOSPITAL_COMMUNITY)
Admission: RE | Admit: 2019-10-30 | Discharge: 2019-10-30 | Disposition: A | Discharge status: Home / Self Care | Payer: Commercial Managed Care - PPO | Source: Ambulatory Visit | Attending: Cardiovascular Disease | Admitting: Cardiovascular Disease

## 2019-10-30 ENCOUNTER — Other Ambulatory Visit: Payer: Self-pay

## 2019-10-30 ENCOUNTER — Other Ambulatory Visit (HOSPITAL_COMMUNITY): Payer: Self-pay | Admitting: Orthopedic Surgery

## 2019-10-30 DIAGNOSIS — M79661 Pain in right lower leg: Secondary | ICD-10-CM

## 2019-10-30 DIAGNOSIS — M7989 Other specified soft tissue disorders: Secondary | ICD-10-CM | POA: Insufficient documentation

## 2019-10-31 ENCOUNTER — Encounter: Payer: Self-pay | Admitting: Family Medicine

## 2019-10-31 NOTE — Telephone Encounter (Signed)
Pt was notified on same day.

## 2019-11-20 ENCOUNTER — Other Ambulatory Visit: Payer: Self-pay | Admitting: Orthopedic Surgery

## 2019-11-20 DIAGNOSIS — M961 Postlaminectomy syndrome, not elsewhere classified: Secondary | ICD-10-CM

## 2019-11-22 ENCOUNTER — Ambulatory Visit
Admission: RE | Admit: 2019-11-22 | Discharge: 2019-11-22 | Disposition: A | Discharge status: Home / Self Care | Payer: Commercial Managed Care - PPO | Source: Ambulatory Visit | Attending: Orthopedic Surgery | Admitting: Orthopedic Surgery

## 2019-11-22 DIAGNOSIS — M961 Postlaminectomy syndrome, not elsewhere classified: Secondary | ICD-10-CM

## 2019-11-22 MED ORDER — METHYLPREDNISOLONE ACETATE 40 MG/ML INJ SUSP (RADIOLOG
120.0000 mg | Freq: Once | INTRAMUSCULAR | Status: AC
  Administered 2019-11-22: 120 mg via EPIDURAL

## 2019-11-22 MED ORDER — IOPAMIDOL (ISOVUE-M 200) INJECTION 41%
1.0000 mL | Freq: Once | INTRAMUSCULAR | Status: AC
  Administered 2019-11-22: 12:00:00 1 mL via EPIDURAL

## 2019-11-22 NOTE — Discharge Instructions (Signed)

## 2019-11-23 ENCOUNTER — Encounter: Payer: Self-pay | Admitting: Family

## 2019-11-23 ENCOUNTER — Ambulatory Visit (INDEPENDENT_AMBULATORY_CARE_PROVIDER_SITE_OTHER): Payer: Commercial Managed Care - PPO | Admitting: Family

## 2019-11-23 DIAGNOSIS — G8929 Other chronic pain: Secondary | ICD-10-CM

## 2019-11-23 DIAGNOSIS — G4700 Insomnia, unspecified: Secondary | ICD-10-CM | POA: Diagnosis not present

## 2019-11-23 DIAGNOSIS — M5441 Lumbago with sciatica, right side: Secondary | ICD-10-CM | POA: Diagnosis not present

## 2019-11-23 DIAGNOSIS — F411 Generalized anxiety disorder: Secondary | ICD-10-CM

## 2019-11-23 MED ORDER — BUSPIRONE HCL 7.5 MG PO TABS: 7.5000 mg | ORAL_TABLET | Freq: Two times a day (BID) | ORAL | 2 refills | Status: DC | PRN

## 2019-11-23 MED ORDER — ESCITALOPRAM OXALATE 10 MG PO TABS: 10.0000 mg | ORAL_TABLET | Freq: Every day | ORAL | 3 refills | Status: DC

## 2019-11-23 NOTE — Progress Notes (Signed)
Virtual Visit via telephone Note Due to COVID-19 pandemic this visit was conducted virtually. This visit type was conducted due to national recommendations for restrictions regarding the COVID-19 Pandemic (e.g. social distancing, sheltering in place) in an effort to limit this patient's exposure and mitigate transmission in our community. All issues noted in this document were discussed and addressed.  A physical exam was not performed with this format.  I connected with KOHNER ORLICK on 11/23/19 at 10:56 am by telephone and verified that I am speaking with the correct person using two identifiers. Thomas York is currently located at home and no one is currently with her during visit. The provider, Evelina Dun, FNP is located in their office at time of visit.  I discussed the limitations, risks, security and privacy concerns of performing an evaluation and management service by telephone and the availability of in person appointments. I also discussed with the patient that there may be a patient responsible charge related to this service. The patient expressed understanding and agreed to proceed.   History and Present Illness:  Back Pain This is a chronic problem. The current episode started more than 1 year ago. The problem occurs constantly. The problem has been gradually worsening since onset. The pain is present in the gluteal. The pain radiates to the right foot and right thigh. The pain is at a severity of 3/10. The pain is moderate. The pain is worse during the night. The symptoms are aggravated by twisting and lying down. Associated symptoms include leg pain and tingling.  Insomnia Primary symptoms: difficulty falling asleep, frequent awakening.  The current episode started more than one year. The problem occurs intermittently. The symptoms are aggravated by family issues and anxiety. Past treatments include medication. The treatment provided no relief.  Anxiety Presents for  follow-up visit. Symptoms include depressed mood, excessive worry, insomnia, irritability, nervous/anxious behavior and restlessness.        Review of Systems  Constitutional: Positive for irritability.  Musculoskeletal: Positive for back pain.  Neurological: Positive for tingling.  Psychiatric/Behavioral: The patient is nervous/anxious and has insomnia.      Observations/Objective: No SOB or distress noted, flat affect  Assessment and Plan: 1. Chronic bilateral low back pain with right-sided sciatica  2. GAD (generalized anxiety disorder) Start lexapro 10 mg today  Can have Buspar as needed for anxiety Stress management Follow up with PCP in 6 weeks to recheck  - escitalopram (LEXAPRO) 10 MG tablet; Take 1 tablet (10 mg total) by mouth daily.  Dispense: 90 tablet; Refill: 3 - busPIRone (BUSPAR) 7.5 MG tablet; Take 1 tablet (7.5 mg total) by mouth 2 (two) times daily as needed.  Dispense: 60 tablet; Refill: 2  3. Insomnia, unspecified type - busPIRone (BUSPAR) 7.5 MG tablet; Take 1 tablet (7.5 mg total) by mouth 2 (two) times daily as needed.  Dispense: 60 tablet; Refill: 2   Follow Up Instructions: 6 weeks to recheck GAD and Insomnia    I discussed the assessment and treatment plan with the patient. The patient was provided an opportunity to ask questions and all were answered. The patient agreed with the plan and demonstrated an understanding of the instructions.   The patient was advised to call back or seek an in-person evaluation if the symptoms worsen or if the condition fails to improve as anticipated.  The above assessment and management plan was discussed with the patient. The patient verbalized understanding of and has agreed to the management plan. Patient is  aware to call the clinic if symptoms persist or worsen. Patient is aware when to return to the clinic for a follow-up visit. Patient educated on when it is appropriate to go to the emergency department.    Time call ended:  11:08 Am   I provided 12 minutes of non-face-to-face time during this encounter.    Jannifer Rodney, FNP

## 2019-12-04 ENCOUNTER — Other Ambulatory Visit: Payer: Self-pay

## 2019-12-04 ENCOUNTER — Encounter: Payer: Self-pay | Admitting: Physical Therapy

## 2019-12-04 ENCOUNTER — Ambulatory Visit: Payer: Commercial Managed Care - PPO | Attending: Orthopedic Surgery | Admitting: Physical Therapy

## 2019-12-04 DIAGNOSIS — G8929 Other chronic pain: Secondary | ICD-10-CM | POA: Insufficient documentation

## 2019-12-04 DIAGNOSIS — M5441 Lumbago with sciatica, right side: Secondary | ICD-10-CM | POA: Diagnosis not present

## 2019-12-04 NOTE — Therapy (Addendum)
[facility] [place], [place], [address] Phone: [phone]   Fax:  [phone]  Physical Therapy Evaluation  Patient Details  Name: Thomas York MRN: [id number] Date of Birth: 09/01/1981 Referring Provider (PT): Melina Schools Md   Encounter Date: 12/04/2019  PT End of Session - 12/04/19 8786    Visit Number  1    Number of Visits  12    Date for PT Re-Evaluation  01/01/20    Authorization Type  FOTO.    PT Start Time  1115    PT Stop Time  1202    PT Time Calculation (min)  47 min    Activity Tolerance  Patient tolerated treatment well    Behavior During Therapy  WFL for tasks assessed/performed       Past Medical History:  Diagnosis Date  . Anxiety    takes Valium daily as needed  . Chronic back pain    HNP   . GERD (gastroesophageal reflux disease)    has taken Zantac daily  . History of kidney stones   . Hyperlipemia    takes Lipitor and Fenofibrate daily  . Hypertension    takes Lisinopril daily  . Insomnia    coming from being in pain  . Weakness    numbness and tingling down right leg    Past Surgical History:  Procedure Laterality Date  . HERNIA REPAIR Right    inguinal   . LUMBAR LAMINECTOMY/DECOMPRESSION MICRODISCECTOMY N/A 05/28/2015   Procedure: L3-S1 HEMILAMINOTOMY, DECOMPRESSION, DISCECTOMY  (3  LEVELS);  Surgeon: Melina Schools, MD;  Location: [facility];  Service: Orthopedics;  Laterality: N/A;    There were no vitals filed for this visit.   Subjective Assessment - 12/04/19 1435    Subjective  Covid screen performed.  The patient presents to the clinic today with c/o right posterior thigh pain and numbness over his right foot.  He cannot remember anyhing that really caused this flare-up about a month ago.  He does recall a time when he sneezed and felt excuriating pain.   His pain is low today but he reports he has been quite sedenatry as of late.  Prolonged standing increases his pain and  moving actually helps decrease his pain somewhat.    Pertinent History  GERD, hernia repair, HTN, laminectomy 05/2015.    How long can you stand comfortably?  15 minutes.    How long can you walk comfortably?  Short community distances.    Patient Stated Goals  Get out of pain and back to work.    Currently in Pain?  Yes    Pain Score  4     Pain Location  Leg    Pain Orientation  Right    Pain Descriptors / Indicators  Aching;Numbness;Tingling    Pain Type  Acute pain    Pain Onset  More than a month ago    Pain Frequency  Constant    Aggravating Factors   See above.    Pain Relieving Factors  See above.         [facility] PT Assessment - 12/04/19 0001      Assessment   Medical Diagnosis  Post-laminectomy syndrome.    Referring Provider (PT)  Melina Schools Md    Onset Date/Surgical Date  --   ~one month.     Precautions   Precautions  None      Restrictions   Weight Bearing Restrictions  No  Balance Screen   Has the patient fallen in the past 6 months  No    Has the patient had a decrease in activity level because of a fear of falling?   No    Is the patient reluctant to leave their home because of a fear of falling?   No      Home Environment   Living Environment  Private residence      Observation/Other Assessments   Focus on Therapeutic Outcomes (FOTO)   46% limitation.      Posture/Postural Control   Posture/Postural Control  No significant limitations      Deep Tendon Reflexes   DTR Assessment Site  Patella;Achilles    Patella DTR  2+    Achilles DTR  --   Normal LT Achillles, left is absent.     ROM / Strength   AROM / PROM / Strength  AROM;Strength      AROM   Overall AROM Comments  Lumbar flexion is 75% of normal, extension= 20 degrees.      Strength   Overall Strength Comments  Right knee flexion= 4/5, right dorsiflexion= 4 to 4+/5 and right great toe extension 4 to 4+/5.      Palpation   Palpation comment  No significant complaint palpable pain  today.      Special Tests   Other special tests  (=) leg lengths.      Ambulation/Gait   Gait Comments  Limpage-type gait.                Objective measurements completed on examination: See above findings.      [facility] Adult PT Treatment/Exercise - 12/04/19 0001      Modalities   Modalities  Electrical Stimulation;Moist Heat      Moist Heat Therapy   Number Minutes Moist Heat  15 Minutes    Moist Heat Location  Lumbar Spine      Electrical Stimulation   Electrical Stimulation Location  Right low back.    Electrical Stimulation Action  Pre-mod.    Electrical Stimulation Parameters  80-150 Hz x 15 minutes.    Electrical Stimulation Goals  Pain                  PT Long Term Goals - 12/04/19 1539      PT LONG TERM GOAL #1   Title  Ind with advanced HEP.    Time  4    Period  Weeks    Status  New      PT LONG TERM GOAL #3   Title  Perform ADL's with pain not > 2/10.    Time  4    Period  Weeks    Status  New      PT LONG TERM GOAL #4   Title  Eliminate right LE symptoms.    Time  4    Period  Weeks    Status  New      PT LONG TERM GOAL #5   Title  Return to work.    Time  4    Period  Weeks    Status  New             Plan - 12/04/19 1512    Clinical Impression Statement  The patient presents to O[facility] with c/o right posterior thigh pain and numbness over his right foot that has been ongoing for about a month.  He is weaker into right knee flexion, right ankle dorsiflexion  and right great toe extension.  He right Achilles reflex is absent.  His pain increases the longer he stands and he has a limp.  The patient is expected to do well wih skilled PT intervention.    Personal Factors and Comorbidities  Comorbidity 1;Comorbidity 2    Comorbidities  GERD, hernia repair, HTN, laminectomy 05/2015.    Examination-Activity Limitations  Stand    Examination-Participation Restrictions  Other    Stability/Clinical Decision Making   Evolving/Moderate complexity    Clinical Decision Making  Moderate    Rehab Potential  Good    PT Frequency  3x / week    PT Duration  4 weeks    PT Treatment/Interventions  ADLs/Self Care Home Management;Cryotherapy;Electrical Stimulation;Ultrasound;Traction;Moist Heat;Therapeutic activities;Therapeutic exercise;Manual techniques;Patient/family education;Passive range of motion    PT Next Visit Plan  Core exercise progression, seated pain-free hamstring strengthening.  Modalites as needed.    Consulted and Agree with Plan of Care  Patient       Patient will benefit from skilled therapeutic intervention in order to improve the following deficits and impairments:  Pain, Decreased activity tolerance, Decreased strength, Decreased mobility  Visit Diagnosis: Chronic right-sided low back pain with right-sided sciatica - Plan: PT plan of care cert/re-cert     Problem List Patient Active Problem List   Diagnosis Date Noted  . Obesity (BMI 30.0-34.9) 01/25/2019  . Hyperlipidemia LDL goal <130 11/01/2016  . Essential hypertension, benign 11/01/2016  . Back pain 05/28/2015    Nariyah Osias, [place] MPT 12/04/2019, 3:39 PM  [facility] [address] [place], [place], [address] Phone: [phone]   Fax:  [phone]  Name: Toula MoosJames M Bachmeier MRN: [id number] Date of Birth: Oct 04, 1981

## 2019-12-06 ENCOUNTER — Ambulatory Visit: Payer: Commercial Managed Care - PPO | Admitting: Physical Therapy

## 2019-12-06 DIAGNOSIS — M5441 Lumbago with sciatica, right side: Secondary | ICD-10-CM | POA: Diagnosis not present

## 2019-12-06 DIAGNOSIS — G8929 Other chronic pain: Secondary | ICD-10-CM

## 2019-12-06 NOTE — Therapy (Signed)
[facility] [place], [place], [address] Phone: [phone]   Fax:  [phone]  Physical Therapy Treatment  Patient Details  Name: Thomas York MRN: [id number] Date of Birth: June 15, 1981 Referring Provider (PT): Melina Schools Md   Encounter Date: 12/06/2019  PT End of Session - 12/06/19 1023    Visit Number  2    Number of Visits  12    Date for PT Re-Evaluation  01/01/20    Authorization Type  FOTO.    PT Start Time  0900    PT Stop Time  0953    PT Time Calculation (min)  53 min    Activity Tolerance  Patient tolerated treatment well    Behavior During Therapy  WFL for tasks assessed/performed       Past Medical History:  Diagnosis Date  . Anxiety    takes Valium daily as needed  . Chronic back pain    HNP   . GERD (gastroesophageal reflux disease)    has taken Zantac daily  . History of kidney stones   . Hyperlipemia    takes Lipitor and Fenofibrate daily  . Hypertension    takes Lisinopril daily  . Insomnia    coming from being in pain  . Weakness    numbness and tingling down right leg    Past Surgical History:  Procedure Laterality Date  . HERNIA REPAIR Right    inguinal   . LUMBAR LAMINECTOMY/DECOMPRESSION MICRODISCECTOMY N/A 05/28/2015   Procedure: L3-S1 HEMILAMINOTOMY, DECOMPRESSION, DISCECTOMY  (3  LEVELS);  Surgeon: Melina Schools, MD;  Location: [facility];  Service: Orthopedics;  Laterality: N/A;    There were no vitals filed for this visit.  Subjective Assessment - 12/06/19 1024    Subjective  Covid screen performed.  No new complaints.    Pertinent History  GERD, hernia repair, HTN, laminectomy 05/2015.    How long can you stand comfortably?  15 minutes.    How long can you walk comfortably?  Short community distances.    Patient Stated Goals  Get out of pain and back to work.    Currently in Pain?  Yes    Pain Score  4     Pain Location  Leg    Pain Orientation  Right    Pain  Descriptors / Indicators  Aching;Numbness;Tingling    Pain Type  Acute pain    Pain Onset  More than a month ago                       [facility] Adult PT Treatment/Exercise - 12/06/19 0001      Exercises   Exercises  Knee/Hip      Knee/Hip Exercises: Aerobic   Stationary Bike  level x 17 minutes.      Knee/Hip Exercises: Machines for Strengthening   Cybex Knee Extension  20# x 3 minutes.    Cybex Knee Flexion  30# x 3 minutes.      Modalities   Modalities  Electrical Stimulation      Moist Heat Therapy   Number Minutes Moist Heat  20 Minutes    Moist Heat Location  Lumbar Spine      Electrical Stimulation   Electrical Stimulation Location  Right low back.    Electrical Stimulation Action  Pre-mod.    Electrical Stimulation Parameters  80-150 Hz x 20 minutes.    Electrical Stimulation Goals  Pain  PT Long Term Goals - 12/04/19 1539      PT LONG TERM GOAL #1   Title  Ind with advanced HEP.    Time  4    Period  Weeks    Status  New      PT LONG TERM GOAL #3   Title  Perform ADL's with pain not > 2/10.    Time  4    Period  Weeks    Status  New      PT LONG TERM GOAL #4   Title  Eliminate right LE symptoms.    Time  4    Period  Weeks    Status  New      PT LONG TERM GOAL #5   Title  Return to work.    Time  4    Period  Weeks    Status  New            Plan - 12/06/19 1023    Clinical Impression Statement  The patient did great today.  No complaints when performing resisted exercise.    Personal Factors and Comorbidities  Comorbidity 1;Comorbidity 2    Comorbidities  GERD, hernia repair, HTN, laminectomy 05/2015.    Examination-Activity Limitations  Stand    Examination-Participation Restrictions  Other    Stability/Clinical Decision Making  Evolving/Moderate complexity    Rehab Potential  Good    PT Frequency  3x / week    PT Duration  4 weeks    PT Treatment/Interventions  ADLs/Self Care Home  Management;Cryotherapy;Electrical Stimulation;Ultrasound;Traction;Moist Heat;Therapeutic activities;Therapeutic exercise;Manual techniques;Patient/family education;Passive range of motion    PT Next Visit Plan  Core exercise progression, seated pain-free hamstring strengthening.  Modalites as needed.    Consulted and Agree with Plan of Care  Patient       Patient will benefit from skilled therapeutic intervention in order to improve the following deficits and impairments:  Pain, Decreased activity tolerance, Decreased strength, Decreased mobility  Visit Diagnosis: Chronic right-sided low back pain with right-sided sciatica     Problem List Patient Active Problem List   Diagnosis Date Noted  . Obesity (BMI 30.0-34.9) 01/25/2019  . Hyperlipidemia LDL goal <130 11/01/2016  . Essential hypertension, benign 11/01/2016  . Back pain 05/28/2015    Thomas York, Thomas York 12/06/2019, 10:26 AM  [facility] [address] [place], [place], [address] Phone: [phone]   Fax:  [phone]  Name: Thomas York MRN: [id number] Date of Birth: 06-Dec-1981

## 2019-12-11 ENCOUNTER — Ambulatory Visit: Payer: Commercial Managed Care - PPO | Admitting: *Deleted

## 2019-12-11 ENCOUNTER — Other Ambulatory Visit: Payer: Self-pay

## 2019-12-11 DIAGNOSIS — G8929 Other chronic pain: Secondary | ICD-10-CM

## 2019-12-11 DIAGNOSIS — M5441 Lumbago with sciatica, right side: Secondary | ICD-10-CM | POA: Diagnosis not present

## 2019-12-11 NOTE — Therapy (Signed)
[facility] [address] [place], [place], [address] Phone: [phone]   Fax:  [phone]  Physical Therapy Treatment  Patient Details  Name: Thomas York MRN: [id number] Date of Birth: May 23, 1981 Referring Provider (PT): Venita Lick Md   Encounter Date: 12/11/2019  PT End of Session - 12/11/19 1651    Visit Number  3    Number of Visits  12    Date for PT Re-Evaluation  01/01/20    Authorization Type  FOTO.    PT Start Time  1600    PT Stop Time  1651    PT Time Calculation (min)  51 min       Past Medical History:  Diagnosis Date  . Anxiety    takes Valium daily as needed  . Chronic back pain    HNP   . GERD (gastroesophageal reflux disease)    has taken Zantac daily  . History of kidney stones   . Hyperlipemia    takes Lipitor and Fenofibrate daily  . Hypertension    takes Lisinopril daily  . Insomnia    coming from being in pain  . Weakness    numbness and tingling down right leg    Past Surgical History:  Procedure Laterality Date  . HERNIA REPAIR Right    inguinal   . LUMBAR LAMINECTOMY/DECOMPRESSION MICRODISCECTOMY N/A 05/28/2015   Procedure: L3-S1 HEMILAMINOTOMY, DECOMPRESSION, DISCECTOMY  (3  LEVELS);  Surgeon: Venita Lick, MD;  Location: [facility] OR;  Service: Orthopedics;  Laterality: N/A;    There were no vitals filed for this visit.  Subjective Assessment - 12/11/19 1602    Subjective  Covid screen performed.  RT side glute and calf    Pertinent History  GERD, hernia repair, HTN, laminectomy 05/2015.    How long can you stand comfortably?  15 minutes.    How long can you walk comfortably?  Short community distances.    Patient Stated Goals  Get out of pain and back to work.    Currently in Pain?  Yes    Pain Score  4     Pain Location  Leg    Pain Orientation  Right    Pain Onset  More than a month ago                       [facility] Adult PT Treatment/Exercise - 12/11/19 0001       Exercises   Exercises  Knee/Hip      Knee/Hip Exercises: Aerobic   Stationary Bike  level  4 x 12 minutes.    Elliptical  L5-10 resistance 5 x 8 mins focus on posture      Knee/Hip Exercises: Machines for Strengthening   Cybex Knee Extension  20# x 3 minutes.    Cybex Knee Flexion  30# x 3 minutes.      Knee/Hip Exercises: Standing   Rocker Board  3 minutes   PF/DF and balance      Modalities   Modalities  Electrical Stimulation      Moist Heat Therapy   Number Minutes Moist Heat  15 Minutes    Moist Heat Location  Lumbar Spine      Electrical Stimulation   Electrical Stimulation Location  Right low back.    Electrical Stimulation Action  pre-mod supine    Electrical Stimulation Parameters  80-150hz  x 15 mins    Electrical Stimulation Goals  Pain  PT Long Term Goals - 12/04/19 1539      PT LONG TERM GOAL #1   Title  Ind with advanced HEP.    Time  4    Period  Weeks    Status  New      PT LONG TERM GOAL #3   Title  Perform ADL's with pain not > 2/10.    Time  4    Period  Weeks    Status  New      PT LONG TERM GOAL #4   Title  Eliminate right LE symptoms.    Time  4    Period  Weeks    Status  New      PT LONG TERM GOAL #5   Title  Return to work.    Time  4    Period  Weeks    Status  New            Plan - 12/11/19 1659    Clinical Impression Statement  Pt arrived today doing fairly well with mainly RT LE weakness and back tightness. He did well with progression of therex with mainly fatigue. He reports he will return to work next Monday 12-17-19. Normal modality response today.    Personal Factors and Comorbidities  Comorbidity 1;Comorbidity 2    Comorbidities  GERD, hernia repair, HTN, laminectomy 05/2015.    Examination-Activity Limitations  Stand    Examination-Participation Restrictions  Other    Stability/Clinical Decision Making  Evolving/Moderate complexity    Rehab Potential  Good    PT Frequency  3x / week     PT Duration  4 weeks    PT Treatment/Interventions  ADLs/Self Care Home Management;Cryotherapy;Electrical Stimulation;Ultrasound;Traction;Moist Heat;Therapeutic activities;Therapeutic exercise;Manual techniques;Patient/family education;Passive range of motion    PT Next Visit Plan  Core exercise progression, seated pain-free hamstring strengthening.  Modalites as needed.       Patient will benefit from skilled therapeutic intervention in order to improve the following deficits and impairments:  Pain, Decreased activity tolerance, Decreased strength, Decreased mobility  Visit Diagnosis: Chronic right-sided low back pain with right-sided sciatica     Problem List Patient Active Problem List   Diagnosis Date Noted  . Obesity (BMI 30.0-34.9) 01/25/2019  . Hyperlipidemia LDL goal <130 11/01/2016  . Essential hypertension, benign 11/01/2016  . Back pain 05/28/2015    Dreyah Montrose,CHRIS, PTA 12/11/2019, 5:06 PM  [facility] [place], [place], [address] Phone: [phone]   Fax:  [phone]  Name: JEOFFREY ELEAZER MRN: [id number] Date of Birth: 29-Jan-1981

## 2019-12-13 ENCOUNTER — Other Ambulatory Visit: Payer: Self-pay

## 2019-12-13 ENCOUNTER — Ambulatory Visit: Payer: Commercial Managed Care - PPO | Admitting: Physical Therapy

## 2019-12-13 DIAGNOSIS — G8929 Other chronic pain: Secondary | ICD-10-CM

## 2019-12-13 DIAGNOSIS — M5441 Lumbago with sciatica, right side: Secondary | ICD-10-CM | POA: Diagnosis not present

## 2019-12-13 NOTE — Therapy (Signed)
[facility] [address] [place], [place], [address] Phone: [phone]   Fax:  [phone]  Physical Therapy Treatment  Patient Details  Name: Thomas York MRN: [id number] Date of Birth: 22-May-1981 Referring Provider (PT): Venita Lick Md   Encounter Date: 12/13/2019  PT End of Session - 12/13/19 1217    Visit Number  4    Number of Visits  12    Date for PT Re-Evaluation  01/01/20    Authorization Type  FOTO.    PT Start Time  1115    PT Stop Time  1205    PT Time Calculation (min)  50 min    Activity Tolerance  Patient tolerated treatment well    Behavior During Therapy  WFL for tasks assessed/performed       Past Medical History:  Diagnosis Date  . Anxiety    takes Valium daily as needed  . Chronic back pain    HNP   . GERD (gastroesophageal reflux disease)    has taken Zantac daily  . History of kidney stones   . Hyperlipemia    takes Lipitor and Fenofibrate daily  . Hypertension    takes Lisinopril daily  . Insomnia    coming from being in pain  . Weakness    numbness and tingling down right leg    Past Surgical History:  Procedure Laterality Date  . HERNIA REPAIR Right    inguinal   . LUMBAR LAMINECTOMY/DECOMPRESSION MICRODISCECTOMY N/A 05/28/2015   Procedure: L3-S1 HEMILAMINOTOMY, DECOMPRESSION, DISCECTOMY  (3  LEVELS);  Surgeon: Venita Lick, MD;  Location: [facility] OR;  Service: Orthopedics;  Laterality: N/A;    There were no vitals filed for this visit.  Subjective Assessment - 12/13/19 1152    Subjective  COVID-19 screen performed prior to patient entering clinic.  Stiff and sore from last treatment.    Pertinent History  GERD, hernia repair, HTN, laminectomy 05/2015.    How long can you stand comfortably?  15 minutes.    How long can you walk comfortably?  Short community distances.    Patient Stated Goals  Get out of pain and back to work.    Currently in Pain?  Yes    Pain Score  4     Pain  Location  Leg    Pain Orientation  Right    Pain Descriptors / Indicators  Aching;Numbness;Tingling    Pain Onset  More than a month ago                       [facility] Adult PT Treatment/Exercise - 12/13/19 0001      Exercises   Exercises  Knee/Hip;Ankle      Knee/Hip Exercises: Aerobic   Stationary Bike  Level 4 x 10 minutes.    Elliptical  L4/4 x 10 minutes.      Knee/Hip Exercises: Machines for Strengthening   Cybex Knee Flexion  40# x 3 minutes.      Ankle Exercises: Standing   Other Standing Ankle Exercises  Dynadisc all directions x 2 minutes.                  PT Long Term Goals - 12/04/19 1539      PT LONG TERM GOAL #1   Title  Ind with advanced HEP.    Time  4    Period  Weeks    Status  New      PT LONG TERM GOAL #  3   Title  Perform ADL's with pain not > 2/10.    Time  4    Period  Weeks    Status  New      PT LONG TERM GOAL #4   Title  Eliminate right LE symptoms.    Time  4    Period  Weeks    Status  New      PT LONG TERM GOAL #5   Title  Return to work.    Time  4    Period  Weeks    Status  New            Plan - 12/13/19 1212    Clinical Impression Statement  Patient stiff and sore form last treatment but doing better overall.  Added dynadisc from right ankle strengthening.    Personal Factors and Comorbidities  Comorbidity 1;Comorbidity 2    Comorbidities  GERD, hernia repair, HTN, laminectomy 05/2015.    Examination-Activity Limitations  Stand    Examination-Participation Restrictions  Other    Stability/Clinical Decision Making  Evolving/Moderate complexity    Rehab Potential  Good    PT Frequency  3x / week    PT Duration  4 weeks    PT Treatment/Interventions  ADLs/Self Care Home Management;Cryotherapy;Electrical Stimulation;Ultrasound;Traction;Moist Heat;Therapeutic activities;Therapeutic exercise;Manual techniques;Patient/family education;Passive range of motion    PT Next Visit Plan  Core exercise  progression, seated pain-free hamstring strengthening.  Modalites as needed.    Consulted and Agree with Plan of Care  Patient       Patient will benefit from skilled therapeutic intervention in order to improve the following deficits and impairments:  Pain, Decreased activity tolerance, Decreased strength, Decreased mobility  Visit Diagnosis: Chronic right-sided low back pain with right-sided sciatica     Problem List Patient Active Problem List   Diagnosis Date Noted  . Obesity (BMI 30.0-34.9) 01/25/2019  . Hyperlipidemia LDL goal <130 11/01/2016  . Essential hypertension, benign 11/01/2016  . Back pain 05/28/2015    Naida Escalante, [place] MPT 12/13/2019, 12:18 PM  [facility] [address] [place], [place], [address] Phone: [phone]   Fax:  [phone]  Name: Thomas York MRN: [id number] Date of Birth: 1981-01-02

## 2019-12-15 ENCOUNTER — Other Ambulatory Visit: Payer: Self-pay | Admitting: Family

## 2019-12-15 DIAGNOSIS — F411 Generalized anxiety disorder: Secondary | ICD-10-CM

## 2019-12-15 DIAGNOSIS — G4700 Insomnia, unspecified: Secondary | ICD-10-CM

## 2019-12-17 NOTE — Telephone Encounter (Signed)
Patient is requesting a 90 day supply but pharmacy needs a diagnosis code added as well. Please advise if this is ok to change and if generalized anxiety if ok to use

## 2019-12-18 ENCOUNTER — Other Ambulatory Visit: Payer: Self-pay

## 2019-12-18 ENCOUNTER — Ambulatory Visit: Payer: Commercial Managed Care - PPO | Attending: Orthopedic Surgery | Admitting: *Deleted

## 2019-12-18 DIAGNOSIS — M5441 Lumbago with sciatica, right side: Secondary | ICD-10-CM | POA: Insufficient documentation

## 2019-12-18 DIAGNOSIS — G8929 Other chronic pain: Secondary | ICD-10-CM | POA: Diagnosis present

## 2019-12-18 NOTE — Therapy (Signed)
[facility] [address] [place], [place], [address] Phone: [phone]   Fax:  [phone]  Physical Therapy Treatment  Patient Details  Name: Thomas York MRN: [id number] Date of Birth: 27-Aug-1981 Referring Provider (PT): Venita Lick Md   Encounter Date: 12/18/2019  PT End of Session - 12/18/19 1538    Visit Number  5    Number of Visits  12    Date for PT Re-Evaluation  01/01/20    Authorization Type  FOTO.    PT Start Time  1515    PT Stop Time  1605    PT Time Calculation (min)  50 min       Past Medical History:  Diagnosis Date  . Anxiety    takes Valium daily as needed  . Chronic back pain    HNP   . GERD (gastroesophageal reflux disease)    has taken Zantac daily  . History of kidney stones   . Hyperlipemia    takes Lipitor and Fenofibrate daily  . Hypertension    takes Lisinopril daily  . Insomnia    coming from being in pain  . Weakness    numbness and tingling down right leg    Past Surgical History:  Procedure Laterality Date  . HERNIA REPAIR Right    inguinal   . LUMBAR LAMINECTOMY/DECOMPRESSION MICRODISCECTOMY N/A 05/28/2015   Procedure: L3-S1 HEMILAMINOTOMY, DECOMPRESSION, DISCECTOMY  (3  LEVELS);  Surgeon: Venita Lick, MD;  Location: [facility] OR;  Service: Orthopedics;  Laterality: N/A;    There were no vitals filed for this visit.  Subjective Assessment - 12/18/19 1539    Subjective  COVID-19 screen performed prior to patient entering clinic.  Stiff and sore from work    Pertinent History  GERD, hernia repair, HTN, laminectomy 05/2015.    How long can you stand comfortably?  15 minutes.    How long can you walk comfortably?  Short community distances.    Patient Stated Goals  Get out of pain and back to work.    Currently in Pain?  Yes    Pain Score  3     Pain Location  Leg    Pain Orientation  Right    Pain Descriptors / Indicators  Aching;Numbness    Pain Type  Acute pain    Pain Onset  More  than a month ago                       [facility] Adult PT Treatment/Exercise - 12/18/19 0001      Exercises   Exercises  Knee/Hip;Ankle      Knee/Hip Exercises: Aerobic   Stationary Bike  Level 4 x 10 minutes.    Elliptical  L4/4 x 10 minutes.      Knee/Hip Exercises: Machines for Strengthening   Cybex Knee Flexion  40# x 3 minutes.      Modalities   Modalities  Electrical Stimulation      Moist Heat Therapy   Number Minutes Moist Heat  15 Minutes    Moist Heat Location  Lumbar Spine      Electrical Stimulation   Electrical Stimulation Location  Right low back.    Electrical Stimulation Action  premod supine    Electrical Stimulation Parameters  80-150hz  x 15 mins    Electrical Stimulation Goals  Pain      Ankle Exercises: Standing   Rocker Board  3 minutes   PF/DF, balance   Other  Standing Ankle Exercises  Dynadisc all directions x 3 minutes.                  PT Long Term Goals - 12/04/19 1539      PT LONG TERM GOAL #1   Title  Ind with advanced HEP.    Time  4    Period  Weeks    Status  New      PT LONG TERM GOAL #3   Title  Perform ADL's with pain not > 2/10.    Time  4    Period  Weeks    Status  New      PT LONG TERM GOAL #4   Title  Eliminate right LE symptoms.    Time  4    Period  Weeks    Status  New      PT LONG TERM GOAL #5   Title  Return to work.    Time  4    Period  Weeks    Status  New            Plan - 12/18/19 1612    Clinical Impression Statement  Pt arrived in clinic doing fairly well mainly with LB stiffness and soreness. He feels that his LT LE is doing better with strength/ endurance, but feels fatigued. He was able to perform therex for LT LE with mainly fatigue and no pain. Continue with POC. add SLS    Personal Factors and Comorbidities  Comorbidity 1;Comorbidity 2    Comorbidities  GERD, hernia repair, HTN, laminectomy 05/2015.    Examination-Activity Limitations  Stand     Examination-Participation Restrictions  Other    PT Frequency  3x / week    PT Duration  4 weeks    PT Treatment/Interventions  ADLs/Self Care Home Management;Cryotherapy;Electrical Stimulation;Ultrasound;Traction;Moist Heat;Therapeutic activities;Therapeutic exercise;Manual techniques;Patient/family education;Passive range of motion    PT Next Visit Plan  Core exercise progression, seated pain-free hamstring strengthening.  Modalites as needed.    Consulted and Agree with Plan of Care  Patient       Patient will benefit from skilled therapeutic intervention in order to improve the following deficits and impairments:  Pain, Decreased activity tolerance, Decreased strength, Decreased mobility  Visit Diagnosis: Chronic right-sided low back pain with right-sided sciatica     Problem List Patient Active Problem List   Diagnosis Date Noted  . Obesity (BMI 30.0-34.9) 01/25/2019  . Hyperlipidemia LDL goal <130 11/01/2016  . Essential hypertension, benign 11/01/2016  . Back pain 05/28/2015    Thomas York,Thomas York, Thomas York 12/18/2019, 4:26 PM  [facility] [place], [place], [address] Phone: [phone]   Fax:  [phone]  Name: Thomas York MRN: [id number] Date of Birth: 1981-01-06

## 2019-12-20 ENCOUNTER — Ambulatory Visit: Payer: Commercial Managed Care - PPO | Admitting: *Deleted

## 2019-12-20 ENCOUNTER — Other Ambulatory Visit: Payer: Self-pay

## 2019-12-20 DIAGNOSIS — M5441 Lumbago with sciatica, right side: Secondary | ICD-10-CM | POA: Diagnosis not present

## 2019-12-20 DIAGNOSIS — G8929 Other chronic pain: Secondary | ICD-10-CM

## 2019-12-20 NOTE — Therapy (Signed)
[facility] [place], [place], [address] Phone: [phone]   Fax:  [phone]  Physical Therapy Treatment  Patient Details  Name: Thomas York MRN: [id number] Date of Birth: 08/25/1981 Referring Provider (PT): Melina Schools Md   Encounter Date: 12/20/2019  PT End of Session - 12/20/19 6962    Visit Number  6    Number of Visits  12    Date for PT Re-Evaluation  01/01/20    Authorization Type  FOTO.    PT Start Time  1515    PT Stop Time  1606    PT Time Calculation (min)  51 min       Past Medical History:  Diagnosis Date  . Anxiety    takes Valium daily as needed  . Chronic back pain    HNP   . GERD (gastroesophageal reflux disease)    has taken Zantac daily  . History of kidney stones   . Hyperlipemia    takes Lipitor and Fenofibrate daily  . Hypertension    takes Lisinopril daily  . Insomnia    coming from being in pain  . Weakness    numbness and tingling down right leg    Past Surgical History:  Procedure Laterality Date  . HERNIA REPAIR Right    inguinal   . LUMBAR LAMINECTOMY/DECOMPRESSION MICRODISCECTOMY N/A 05/28/2015   Procedure: L3-S1 HEMILAMINOTOMY, DECOMPRESSION, DISCECTOMY  (3  LEVELS);  Surgeon: Melina Schools, MD;  Location: [place];  Service: Orthopedics;  Laterality: N/A;    There were no vitals filed for this visit.  Subjective Assessment - 12/20/19 1529    Subjective  COVID-19 screen performed prior to patient entering clinic.  Doing better overall    Pertinent History  GERD, hernia repair, HTN, laminectomy 05/2015.    How long can you stand comfortably?  15 minutes.    How long can you walk comfortably?  Short community distances.    Patient Stated Goals  Get out of pain and back to work.    Currently in Pain?  Yes    Pain Score  3     Pain Location  Leg    Pain Orientation  Right    Pain Descriptors / Indicators  Aching;Numbness    Pain Onset  More than a month ago                        [facility] Adult PT Treatment/Exercise - 12/20/19 0001      Exercises   Exercises  Knee/Hip;Ankle      Knee/Hip Exercises: Aerobic   Stationary Bike  Level 4 x 10 minutes.    Elliptical  L 5/5 x 10 minutes.      Knee/Hip Exercises: Machines for Strengthening   Cybex Knee Flexion  40# x 3 minutes.   3 x fatigue     Modalities   Modalities  Electrical Stimulation      Moist Heat Therapy   Number Minutes Moist Heat  15 Minutes    Moist Heat Location  Lumbar Spine      Electrical Stimulation   Electrical Stimulation Location  Right low back.    Child psychotherapist Parameters  80-150hz     Electrical Stimulation Goals  Pain      Ankle Exercises: Standing   Rocker Board  3 minutes   PF/DF, balance   Other Standing Ankle Exercises  Dynadisc all directions x 3  minutes.                  PT Long Term Goals - 12/20/19 1623      PT LONG TERM GOAL #1   Title  Ind with advanced HEP.    Time  4    Period  Weeks    Status  On-going      PT LONG TERM GOAL #2   Title  Right hip flexion= 115 degrees.    Time  4    Period  Weeks    Status  Achieved      PT LONG TERM GOAL #3   Title  Perform ADL's with pain not > 2/10.    Time  4    Period  Weeks    Status  On-going      PT LONG TERM GOAL #4   Title  Eliminate right LE symptoms.    Time  4    Period  Weeks    Status  On-going      PT LONG TERM GOAL #5   Title  Return to work.    Time  4    Period  Weeks    Status  Achieved            Plan - 12/20/19 1616    Clinical Impression Statement  Pt arrived todaydoing better over all with increased strength in LT LE, but starts to limp due to fatigue. He was guided through therex and balance act.'s for LT LE and did well. No noted limp today in clinic. His DF  MMT was 4+/5.Marland Kitchen    Personal Factors and Comorbidities  Comorbidity 1;Comorbidity 2    Comorbidities  GERD, hernia repair, HTN,  laminectomy 05/2015.    Examination-Activity Limitations  Stand    Examination-Participation Restrictions  Other    Stability/Clinical Decision Making  Evolving/Moderate complexity    Rehab Potential  Good    PT Frequency  3x / week    PT Duration  4 weeks    PT Treatment/Interventions  ADLs/Self Care Home Management;Cryotherapy;Electrical Stimulation;Ultrasound;Traction;Moist Heat;Therapeutic activities;Therapeutic exercise;Manual techniques;Patient/family education;Passive range of motion    PT Next Visit Plan  Core exercise progression, seated pain-free hamstring strengthening.  Modalites as needed.    Consulted and Agree with Plan of Care  Patient       Patient will benefit from skilled therapeutic intervention in order to improve the following deficits and impairments:  Pain, Decreased activity tolerance, Decreased strength, Decreased mobility  Visit Diagnosis: Chronic right-sided low back pain with right-sided sciatica     Problem List Patient Active Problem List   Diagnosis Date Noted  . Obesity (BMI 30.0-34.9) 01/25/2019  . Hyperlipidemia LDL goal <130 11/01/2016  . Essential hypertension, benign 11/01/2016  . Back pain 05/28/2015    Elwyn Lowden,CHRIS, PTA 12/20/2019, 4:24 PM  [facility] [address] [place], [place], [address] Phone: [phone]   Fax:  [phone]  Name: Thomas York MRN: [id number] Date of Birth: September 06, 1981

## 2019-12-25 ENCOUNTER — Other Ambulatory Visit: Payer: Self-pay

## 2019-12-25 ENCOUNTER — Ambulatory Visit: Payer: Commercial Managed Care - PPO | Admitting: Physical Therapy

## 2019-12-25 DIAGNOSIS — M5441 Lumbago with sciatica, right side: Secondary | ICD-10-CM | POA: Diagnosis not present

## 2019-12-25 DIAGNOSIS — G8929 Other chronic pain: Secondary | ICD-10-CM

## 2019-12-25 NOTE — Therapy (Signed)
[facility] [address] [place], [place], [address] Phone: [phone]   Fax:  [phone]  Physical Therapy Treatment  Patient Details  Name: Thomas York MRN: [id number] Date of Birth: October 08, 1981 Referring Provider (PT): Venita Lick Md   Encounter Date: 12/25/2019  PT End of Session - 12/25/19 1656    Visit Number  7    Number of Visits  12    Date for PT Re-Evaluation  01/01/20    Authorization Type  FOTO.    PT Start Time  0315    PT Stop Time  0412    PT Time Calculation (min)  57 min    Activity Tolerance  Patient tolerated treatment well    Behavior During Therapy  [facility] for tasks assessed/performed       Past Medical History:  Diagnosis Date  . Anxiety    takes Valium daily as needed  . Chronic back pain    HNP   . GERD (gastroesophageal reflux disease)    has taken Zantac daily  . History of kidney stones   . Hyperlipemia    takes Lipitor and Fenofibrate daily  . Hypertension    takes Lisinopril daily  . Insomnia    coming from being in pain  . Weakness    numbness and tingling down right leg    Past Surgical History:  Procedure Laterality Date  . HERNIA REPAIR Right    inguinal   . LUMBAR LAMINECTOMY/DECOMPRESSION MICRODISCECTOMY N/A 05/28/2015   Procedure: L3-S1 HEMILAMINOTOMY, DECOMPRESSION, DISCECTOMY  (3  LEVELS);  Surgeon: Venita Lick, MD;  Location: [facility] OR;  Service: Orthopedics;  Laterality: N/A;    There were no vitals filed for this visit.                    [facility] Adult PT Treatment/Exercise - 12/25/19 0001      Exercises   Exercises  Knee/Hip      Knee/Hip Exercises: Aerobic   Stationary Bike  Level 4 x 12 minutes.    Elliptical  L4/4 x 10 minutes.      Knee/Hip Exercises: Machines for Strengthening   Cybex Knee Flexion  40# x 3 minutes.      Modalities   Modalities  Electrical Stimulation;Moist Heat      Moist Heat Therapy   Number Minutes Moist Heat  20 Minutes     Moist Heat Location  Lumbar Spine      Electrical Stimulation   Electrical Stimulation Location  RT LB    Electrical Stimulation Action  Pre-mod.    Electrical Stimulation Parameters  80-150 Hz x 20 minutes.    Electrical Stimulation Goals  Pain      Ankle Exercises: Standing   Rocker Board  3 minutes                  PT Long Term Goals - 12/20/19 1623      PT LONG TERM GOAL #1   Title  Ind with advanced HEP.    Time  4    Period  Weeks    Status  On-going      PT LONG TERM GOAL #2   Title  Right hip flexion= 115 degrees.    Time  4    Period  Weeks    Status  Achieved      PT LONG TERM GOAL #3   Title  Perform ADL's with pain not > 2/10.    Time  4  Period  Weeks    Status  On-going      PT LONG TERM GOAL #4   Title  Eliminate right LE symptoms.    Time  4    Period  Weeks    Status  On-going      PT LONG TERM GOAL #5   Title  Return to work.    Time  4    Period  Weeks    Status  Achieved            Plan - 12/25/19 1702    Clinical Impression Statement  Patient doing better overall.  Notable improvement in gait.    Personal Factors and Comorbidities  Comorbidity 1;Comorbidity 2    Comorbidities  GERD, hernia repair, HTN, laminectomy 05/2015.    Examination-Activity Limitations  Stand    Examination-Participation Restrictions  Other    Stability/Clinical Decision Making  Evolving/Moderate complexity    Rehab Potential  Good    PT Frequency  3x / week    PT Duration  4 weeks    PT Treatment/Interventions  ADLs/Self Care Home Management;Cryotherapy;Electrical Stimulation;Ultrasound;Traction;Moist Heat;Therapeutic activities;Therapeutic exercise;Manual techniques;Patient/family education;Passive range of motion    PT Next Visit Plan  Core exercise progression, seated pain-free hamstring strengthening.  Modalites as needed.    Consulted and Agree with Plan of Care  Patient       Patient will benefit from skilled therapeutic intervention  in order to improve the following deficits and impairments:  Pain, Decreased activity tolerance, Decreased strength, Decreased mobility  Visit Diagnosis: Chronic right-sided low back pain with right-sided sciatica     Problem List Patient Active Problem List   Diagnosis Date Noted  . Obesity (BMI 30.0-34.9) 01/25/2019  . Hyperlipidemia LDL goal <130 11/01/2016  . Essential hypertension, benign 11/01/2016  . Back pain 05/28/2015    Gwendelyn Lanting, [place] MPT 12/25/2019, 5:07 PM  [facility] [address] [place], [place], [address] Phone: [phone]   Fax:  [phone]  Name: Thomas York MRN: [id number] Date of Birth: 1981/06/10

## 2019-12-27 ENCOUNTER — Ambulatory Visit: Payer: Commercial Managed Care - PPO | Admitting: Physical Therapy

## 2019-12-27 ENCOUNTER — Encounter: Payer: Self-pay | Admitting: Physical Therapy

## 2019-12-27 ENCOUNTER — Other Ambulatory Visit: Payer: Self-pay

## 2019-12-27 DIAGNOSIS — M5441 Lumbago with sciatica, right side: Secondary | ICD-10-CM

## 2019-12-27 DIAGNOSIS — G8929 Other chronic pain: Secondary | ICD-10-CM

## 2019-12-27 NOTE — Therapy (Signed)
[place], [place], [address] Phone: [phone]   Fax:  [phone]  Physical Therapy Treatment  Patient Details  Name: Thomas York MRN: [id number] Date of Birth: Aug 29, 1981 Referring Provider (PT): Thomas York   Encounter Date: 12/27/2019  PT End of Session - 12/27/19 1555    Visit Number  8    Number of Visits  12    Date for PT Re-Evaluation  01/01/20    Authorization Type  FOTO.    PT Start Time  0315    PT Stop Time  0410    PT Time Calculation (min)  55 min    Activity Tolerance  Patient tolerated treatment well    Behavior During Therapy  WFL for tasks assessed/performed       Past Medical History:  Diagnosis Date  . Anxiety    takes Valium daily as needed  . Chronic back pain    HNP   . GERD (gastroesophageal reflux disease)    has taken Zantac daily  . History of kidney stones   . Hyperlipemia    takes Lipitor and Fenofibrate daily  . Hypertension    takes Lisinopril daily  . Insomnia    coming from being in pain  . Weakness    numbness and tingling down right leg    Past Surgical History:  Procedure Laterality Date  . HERNIA REPAIR Right    inguinal   . LUMBAR LAMINECTOMY/DECOMPRESSION MICRODISCECTOMY N/A 05/28/2015   Procedure: L3-S1 HEMILAMINOTOMY, DECOMPRESSION, DISCECTOMY  (3  LEVELS);  Surgeon: Thomas Schools, [place];  Location: [facility];  Service: Orthopedics;  Laterality: N/A;    There were no vitals filed for this visit.  Subjective Assessment - 12/27/19 1551    Subjective  COVID-19 screen performed prior to patient entering clinic.  getting better.    Pertinent History  GERD, hernia repair, HTN, laminectomy 05/2015.    How long can you stand comfortably?  15 minutes.    How long can you walk comfortably?  Short community distances.    Patient Stated Goals  Get out of pain and back to work.    Currently in Pain?  Yes    Pain Location  Leg    Pain Orientation  Right    Pain Type  Acute pain    Pain Onset  More than a month ago                       [facility] Adult PT Treatment/Exercise - 12/27/19 0001      Exercises   Exercises  Knee/Hip      Knee/Hip Exercises: Aerobic   Stationary Bike  Level 4 x 10 minutes.    Elliptical  Level 4/4 x 10 minutes.      Knee/Hip Exercises: Machines for Strengthening   Cybex Knee Flexion  50# x 3 minutes.      Modalities   Modalities  Electrical Stimulation;Moist Heat      Moist Heat Therapy   Number Minutes Moist Heat  20 Minutes    Moist Heat Location  Lumbar Spine      Electrical Stimulation   Electrical Stimulation Location  RT LB    Electrical Stimulation Action  Pre-mod.    Electrical Stimulation Parameters  80-150 Hz x 20 minutes.    Electrical Stimulation Goals  Pain      Ankle Exercises: Seated   Other Seated Ankle Exercises  Ankle isolator with 2# x 2  minutes.                  PT Long Term Goals - 12/27/19 1556      PT LONG TERM GOAL #1   Title  Ind with advanced HEP.    Time  4    Period  Weeks    Status  On-going      PT LONG TERM GOAL #2   Title  Right hip flexion= 115 degrees.    Time  4    Period  Weeks    Status  Achieved      PT LONG TERM GOAL #3   Title  Perform ADL's with pain not > 2/10.    Time  4    Period  Weeks    Status  On-going      PT LONG TERM GOAL #4   Title  Eliminate right LE symptoms.    Time  4    Period  Weeks    Status  On-going      PT LONG TERM GOAL #5   Title  Return to work.    Time  4    Period  Weeks    Status  Achieved            Plan - 12/27/19 1558    Clinical Impression Statement  The patient is making very good progress and has returne dto wok at this time.    Personal Factors and Comorbidities  Comorbidity 1;Comorbidity 2    Comorbidities  GERD, hernia repair, HTN, laminectomy 05/2015.    Examination-Activity Limitations  Stand    Examination-Participation Restrictions  Other    Stability/Clinical  Decision Making  Evolving/Moderate complexity    Rehab Potential  Good    PT Frequency  3x / week    PT Duration  4 weeks    PT Treatment/Interventions  ADLs/Self Care Home Management;Cryotherapy;Electrical Stimulation;Ultrasound;Traction;Moist Heat;Therapeutic activities;Therapeutic exercise;Manual techniques;Patient/family education;Passive range of motion    PT Next Visit Plan  Core exercise progression, seated pain-free hamstring strengthening.  Modalites as needed.    Consulted and Agree with Plan of Care  Patient       Patient will benefit from skilled therapeutic intervention in order to improve the following deficits and impairments:  Pain, Decreased activity tolerance, Decreased strength, Decreased mobility  Visit Diagnosis: Chronic right-sided low back pain with right-sided sciatica     Problem List Patient Active Problem List   Diagnosis Date Noted  . Obesity (BMI 30.0-34.9) 01/25/2019  . Hyperlipidemia LDL goal <130 11/01/2016  . Essential hypertension, benign 11/01/2016  . Back pain 05/28/2015    Thomas York, Thomas York  MPT 12/27/2019, 4:55 PM  [facility] [address] [place], [place], [address] Phone: [phone]   Fax:  [phone]  Name: Thomas York MRN: [id number] Date of Birth: 05/09/1981

## 2020-01-01 ENCOUNTER — Ambulatory Visit: Payer: Commercial Managed Care - PPO | Admitting: *Deleted

## 2020-01-01 ENCOUNTER — Other Ambulatory Visit: Payer: Self-pay

## 2020-01-01 DIAGNOSIS — M5441 Lumbago with sciatica, right side: Secondary | ICD-10-CM | POA: Diagnosis not present

## 2020-01-01 DIAGNOSIS — G8929 Other chronic pain: Secondary | ICD-10-CM

## 2020-01-01 NOTE — Therapy (Signed)
[facility] [address] [place], [place], [address] Phone: [phone]   Fax:  [phone]  Physical Therapy Treatment  Patient Details  Name: Thomas York MRN: [id number] Date of Birth: [birth year] Referring Provider (PT): Venita Lick Md   Encounter Date: 01/01/2020  PT End of Session - 01/01/20 1523    Visit Number  9    Number of Visits  12    Date for PT Re-Evaluation  01/01/20    Authorization Type  FOTO.    10th visit    PT Start Time  1515    PT Stop Time  1605    PT Time Calculation (min)  50 min    Activity Tolerance  Patient tolerated treatment well    Behavior During Therapy  WFL for tasks assessed/performed       Past Medical History:  Diagnosis Date  . Anxiety    takes Valium daily as needed  . Chronic back pain    HNP   . GERD (gastroesophageal reflux disease)    has taken Zantac daily  . History of kidney stones   . Hyperlipemia    takes Lipitor and Fenofibrate daily  . Hypertension    takes Lisinopril daily  . Insomnia    coming from being in pain  . Weakness    numbness and tingling down right leg    Past Surgical History:  Procedure Laterality Date  . HERNIA REPAIR Right    inguinal   . LUMBAR LAMINECTOMY/DECOMPRESSION MICRODISCECTOMY N/A 05/28/2015   Procedure: L3-S1 HEMILAMINOTOMY, DECOMPRESSION, DISCECTOMY  (3  LEVELS);  Surgeon: Venita Lick, MD;  Location: [facility] OR;  Service: Orthopedics;  Laterality: N/A;    There were no vitals filed for this visit.  Subjective Assessment - 01/01/20 1522    Subjective  COVID-19 screen performed prior to patient entering clinic.  RT leg tight and stiff    Pertinent History  GERD, hernia repair, HTN, laminectomy 05/2015.    How long can you stand comfortably?  15 minutes.    How long can you walk comfortably?  Short community distances.    Patient Stated Goals  Get out of pain and back to work.    Currently in Pain?  Yes    Pain Score  2     Pain  Location  Leg    Pain Orientation  Right    Pain Descriptors / Indicators  Aching;Numbness    Pain Type  Acute pain    Pain Onset  More than a month ago                       [facility] Adult PT Treatment/Exercise - 01/01/20 0001      Exercises   Exercises  Knee/Hip      Knee/Hip Exercises: Stretches   Passive Hamstring Stretch  5 reps;30 seconds    Piriformis Stretch  5 reps;30 seconds    Other Knee/Hip Stretches  Lateral hip stretching      Knee/Hip Exercises: Aerobic   Stationary Bike  Level 4 x 10 minutes.    Elliptical  Level 5/5 x 10 minutes.      Knee/Hip Exercises: Machines for Strengthening   Cybex Knee Flexion  50# x 3 minutes.      Modalities   Modalities  Electrical Stimulation;Moist Heat      Moist Heat Therapy   Number Minutes Moist Heat  15 Minutes    Moist Heat Location  Lumbar Spine  Microbiologist Parameters  80-150hz  x 15 mins    Electrical Stimulation Goals  Pain      Ankle Exercises: Seated   Other Seated Ankle Exercises  Ankle isolator with 2# x 2 minutes.      Ankle Exercises: Standing   Rocker Board  3 minutes                  PT Long Term Goals - 12/27/19 1556      PT LONG TERM GOAL #1   Title  Ind with advanced HEP.    Time  4    Period  Weeks    Status  On-going      PT LONG TERM GOAL #2   Title  Right hip flexion= 115 degrees.    Time  4    Period  Weeks    Status  Achieved      PT LONG TERM GOAL #3   Title  Perform ADL's with pain not > 2/10.    Time  4    Period  Weeks    Status  On-going      PT LONG TERM GOAL #4   Title  Eliminate right LE symptoms.    Time  4    Period  Weeks    Status  On-going      PT LONG TERM GOAL #5   Title  Return to work.    Time  4    Period  Weeks    Status  Achieved            Plan - 01/01/20 1531    Clinical Impression Statement   Patient arrived today doing fairly well with mainly reporting RT LE stiffness.. Pt was guided through strengthening exs for RT LE f/b HS, piriformis, and lateral hip stretching. Normal modality response today    Personal Factors and Comorbidities  Comorbidity 1;Comorbidity 2    Stability/Clinical Decision Making  Evolving/Moderate complexity    Rehab Potential  Good    PT Frequency  3x / week    PT Duration  4 weeks    PT Treatment/Interventions  ADLs/Self Care Home Management;Cryotherapy;Electrical Stimulation;Ultrasound;Traction;Moist Heat;Therapeutic activities;Therapeutic exercise;Manual techniques;Patient/family education;Passive range of motion    PT Next Visit Plan  Core exercise progression, seated pain-free hamstring strengthening.  Modalites as needed.    Consulted and Agree with Plan of Care  Patient       Patient will benefit from skilled therapeutic intervention in order to improve the following deficits and impairments:     Visit Diagnosis: Chronic right-sided low back pain with right-sided sciatica     Problem List Patient Active Problem List   Diagnosis Date Noted  . Obesity (BMI 30.0-34.9) 01/25/2019  . Hyperlipidemia LDL goal <130 11/01/2016  . Essential hypertension, benign 11/01/2016  . Back pain 05/28/2015    Preslie Depasquale,CHRIS, PTA 01/01/2020, 5:19 PM  [facility] [place], [place], [address] Phone: [phone]   Fax:  [phone]  Name: ANDREU DRUDGE MRN: [id number] Date of Birth: [birth year]

## 2020-01-03 ENCOUNTER — Ambulatory Visit: Payer: Commercial Managed Care - PPO | Admitting: *Deleted

## 2020-01-03 ENCOUNTER — Other Ambulatory Visit: Payer: Self-pay

## 2020-01-03 DIAGNOSIS — G8929 Other chronic pain: Secondary | ICD-10-CM

## 2020-01-03 DIAGNOSIS — M5441 Lumbago with sciatica, right side: Secondary | ICD-10-CM | POA: Diagnosis not present

## 2020-01-03 NOTE — Therapy (Signed)
[facility] [place], [place], [address] Phone: [phone]   Fax:  [phone]  Physical Therapy Treatment  Patient Details  Name: Thomas York MRN: [id number] Date of Birth: Apr 10, 1981 Referring Provider (PT): Melina Schools Md   Encounter Date: 01/03/2020  PT End of Session - 01/03/20 4627    Visit Number  10    Number of Visits  12    Date for PT Re-Evaluation  01/01/20    Authorization Type  FOTO.    10th visit 31% limited    PT Start Time  1515    PT Stop Time  1605    PT Time Calculation (min)  50 min       Past Medical History:  Diagnosis Date  . Anxiety    takes Valium daily as needed  . Chronic back pain    HNP   . GERD (gastroesophageal reflux disease)    has taken Zantac daily  . History of kidney stones   . Hyperlipemia    takes Lipitor and Fenofibrate daily  . Hypertension    takes Lisinopril daily  . Insomnia    coming from being in pain  . Weakness    numbness and tingling down right leg    Past Surgical History:  Procedure Laterality Date  . HERNIA REPAIR Right    inguinal   . LUMBAR LAMINECTOMY/DECOMPRESSION MICRODISCECTOMY N/A 05/28/2015   Procedure: L3-S1 HEMILAMINOTOMY, DECOMPRESSION, DISCECTOMY  (3  LEVELS);  Surgeon: Melina Schools, MD;  Location: [facility];  Service: Orthopedics;  Laterality: N/A;    There were no vitals filed for this visit.  Subjective Assessment - 01/03/20 1548    Subjective  COVID-19 screen performed prior to patient entering clinic.   Did great after last Rx.    Pertinent History  GERD, hernia repair, HTN, laminectomy 05/2015.    How long can you walk comfortably?  Short community distances.    Patient Stated Goals  Get out of pain and back to work.    Currently in Pain?  Yes    Pain Score  1     Pain Location  Leg    Pain Orientation  Right    Pain Descriptors / Indicators  Aching;Numbness    Pain Onset  More than a month ago                        [facility] Adult PT Treatment/Exercise - 01/03/20 0001      Exercises   Exercises  Knee/Hip      Knee/Hip Exercises: Stretches   Passive Hamstring Stretch  5 reps;30 seconds    Piriformis Stretch  --      Knee/Hip Exercises: Aerobic   Stationary Bike  Level 4 x 10 minutes.    Elliptical  Level 5/5 x 10 minutes.      Knee/Hip Exercises: Machines for Strengthening   Cybex Knee Flexion  50# x 3 minutes.      Modalities   Modalities  Electrical Stimulation;Moist Heat      Moist Heat Therapy   Number Minutes Moist Heat  15 Minutes    Moist Heat Location  Lumbar Spine      Electrical Stimulation   Electrical Stimulation Location  Rt LB    Electrical Stimulation Action  Premod    Electrical Stimulation Parameters  80-'150hz'  x 15 mins    Electrical Stimulation Goals  Pain      Ankle Exercises: Seated  Other Seated Ankle Exercises  Ankle isolator with 2# x 2 minutes.      Ankle Exercises: Standing   Rocker Board  3 minutes    Other Standing Ankle Exercises  Dynadisc x 3 mins Cw/CCW                  PT Long Term Goals - 01/03/20 1732      PT LONG TERM GOAL #1   Title  Ind with advanced HEP.    Time  4    Period  Weeks    Status  On-going      PT LONG TERM GOAL #2   Title  Right hip flexion= 115 degrees.    Time  4    Period  Weeks    Status  Achieved      PT LONG TERM GOAL #3   Title  Perform ADL's with pain not > 2/10.    Time  4    Period  Weeks    Status  On-going      PT LONG TERM GOAL #4   Title  Eliminate right LE symptoms.    Time  4    Period  Weeks    Status  On-going      PT LONG TERM GOAL #5   Title  Return to work.    Time  4    Period  Weeks    Status  Achieved            Plan - 01/03/20 1610    Clinical Impression Statement  Pt arrived today doing better after last Rx with decreased pain and tightness in LT LE. Pt was able to continue with strengthening progression with mainly tightness in LT  HS. His FOTO score was 31% limited  and goal was met.    Personal Factors and Comorbidities  Comorbidity 1;Comorbidity 2    Comorbidities  GERD, hernia repair, HTN, laminectomy 05/2015.    Examination-Participation Restrictions  Other    Stability/Clinical Decision Making  Evolving/Moderate complexity    Rehab Potential  Good    PT Frequency  3x / week    PT Duration  4 weeks    PT Treatment/Interventions  ADLs/Self Care Home Management;Cryotherapy;Electrical Stimulation;Ultrasound;Traction;Moist Heat;Therapeutic activities;Therapeutic exercise;Manual techniques;Patient/family education;Passive range of motion    PT Next Visit Plan  Core exercise progression, seated pain-free hamstring strengthening.  Modalites as needed.    Consulted and Agree with Plan of Care  Patient       Patient will benefit from skilled therapeutic intervention in order to improve the following deficits and impairments:  Pain, Decreased activity tolerance, Decreased strength, Decreased mobility  Visit Diagnosis: Chronic right-sided low back pain with right-sided sciatica     Problem List Patient Active Problem List   Diagnosis Date Noted  . Obesity (BMI 30.0-34.9) 01/25/2019  . Hyperlipidemia LDL goal <130 11/01/2016  . Essential hypertension, benign 11/01/2016  . Back pain 05/28/2015    Jaymin Waln,CHRIS, PTA 01/03/2020, 5:35 PM  [facility] [address] [place], [place], [address] Phone: [phone]   Fax:  [phone]  Name: AVANTAE BITHER MRN: [id number] Date of Birth: January 03, 1981

## 2020-01-30 ENCOUNTER — Other Ambulatory Visit: Payer: Self-pay | Admitting: Family Medicine

## 2020-01-30 DIAGNOSIS — I10 Essential (primary) hypertension: Secondary | ICD-10-CM

## 2020-01-30 MED ORDER — LISINOPRIL 10 MG PO TABS: 10.0000 mg | ORAL_TABLET | Freq: Every day | ORAL | 0 refills | Status: DC

## 2020-01-30 NOTE — Telephone Encounter (Signed)
Patient advised via voicemail that we can only fill a 30 day supply and he will need to contact our office to schedule an appointment for further refills.

## 2020-01-31 ENCOUNTER — Encounter: Payer: Self-pay | Admitting: Family Medicine

## 2020-01-31 ENCOUNTER — Ambulatory Visit (INDEPENDENT_AMBULATORY_CARE_PROVIDER_SITE_OTHER): Payer: Commercial Managed Care - PPO | Admitting: Family Medicine

## 2020-01-31 ENCOUNTER — Other Ambulatory Visit: Payer: Self-pay

## 2020-01-31 DIAGNOSIS — I10 Essential (primary) hypertension: Secondary | ICD-10-CM | POA: Diagnosis not present

## 2020-01-31 DIAGNOSIS — E669 Obesity, unspecified: Secondary | ICD-10-CM

## 2020-01-31 DIAGNOSIS — E785 Hyperlipidemia, unspecified: Secondary | ICD-10-CM

## 2020-01-31 MED ORDER — ATORVASTATIN CALCIUM 20 MG PO TABS: 20.0000 mg | ORAL_TABLET | Freq: Every day | ORAL | 3 refills | Status: DC

## 2020-01-31 MED ORDER — LISINOPRIL 10 MG PO TABS: 10.0000 mg | ORAL_TABLET | Freq: Every day | ORAL | 3 refills | Status: DC

## 2020-01-31 NOTE — Progress Notes (Signed)
Virtual Visit via telephone Note  I connected with Thomas York on 01/31/20 at 1418 by telephone and verified that I am speaking with the correct person using two identifiers. Thomas York is currently located at home and no other people are currently with her during visit. The provider, Fransisca Kaufmann Joseangel Nettleton, MD is located in their office at time of visit.  Call ended at 1431  I discussed the limitations, risks, security and privacy concerns of performing an evaluation and management service by telephone and the availability of in person appointments. I also discussed with the patient that there may be a patient responsible charge related to this service. The patient expressed understanding and agreed to proceed.   History and Present Illness: Hyperlipidemia Patient is coming in for recheck of his hyperlipidemia. The patient is currently taking lipitor. They deny any issues with myalgias or history of liver damage from it. They deny any focal numbness or weakness or chest pain.   Anxiety and depression Patient had anxiety during his time off for surgery.  He still has back pain and tried gabapentin but had dreams, maybe consider cymbalta.   Hypertension Patient is currently on lisinopril, and their blood pressure today is 132/84. Patient denies any lightheadedness or dizziness. Patient denies headaches, blurred vision, chest pains, shortness of breath, or weakness. Denies any side effects from medication and is content with current medication.   No diagnosis found.  Outpatient Encounter Medications as of 01/31/2020  Medication Sig  . atorvastatin (LIPITOR) 20 MG tablet Take 1 tablet (20 mg total) by mouth daily.  . busPIRone (BUSPAR) 7.5 MG tablet Take 1 tablet (7.5 mg total) by mouth 2 (two) times daily.  Marland Kitchen escitalopram (LEXAPRO) 10 MG tablet Take 1 tablet (10 mg total) by mouth daily. (Patient not taking: Reported on 12/04/2019)  . gabapentin (NEURONTIN) 300 MG capsule Take 300 mg by  mouth at bedtime.  Marland Kitchen lisinopril (ZESTRIL) 10 MG tablet Take 1 tablet (10 mg total) by mouth daily. Needs to be seen for further refills.   No facility-administered encounter medications on file as of 01/31/2020.    Review of Systems  Constitutional: Negative for chills and fever.  Eyes: Negative for visual disturbance.  Respiratory: Negative for shortness of breath and wheezing.   Cardiovascular: Negative for chest pain and leg swelling.  Musculoskeletal: Positive for back pain. Negative for gait problem.  Skin: Negative for rash.  Neurological: Positive for numbness. Negative for dizziness and weakness.  Psychiatric/Behavioral: Negative for dysphoric mood and sleep disturbance. The patient is not nervous/anxious.   All other systems reviewed and are negative.   Observations/Objective: Patient sounds comfortable and in no acute distress  Assessment and Plan: Problem List Items Addressed This Visit      Cardiovascular and Mediastinum   Essential hypertension, benign - Primary   Relevant Medications   lisinopril (ZESTRIL) 10 MG tablet   atorvastatin (LIPITOR) 20 MG tablet   Other Relevant Orders   CBC with Differential/Platelet   CMP14+EGFR   Lipid panel     Other   Hyperlipidemia LDL goal <130   Relevant Medications   lisinopril (ZESTRIL) 10 MG tablet   atorvastatin (LIPITOR) 20 MG tablet   Other Relevant Orders   CMP14+EGFR   Lipid panel   Obesity (BMI 30.0-34.9)       Follow up plan: Return in about 6 months (around 07/30/2020), or if symptoms worsen or fail to improve, for htn hld.     I discussed the assessment  and treatment plan with the patient. The patient was provided an opportunity to ask questions and all were answered. The patient agreed with the plan and demonstrated an understanding of the instructions.   The patient was advised to call back or seek an in-person evaluation if the symptoms worsen or if the condition fails to improve as anticipated.  The  above assessment and management plan was discussed with the patient. The patient verbalized understanding of and has agreed to the management plan. Patient is aware to call the clinic if symptoms persist or worsen. Patient is aware when to return to the clinic for a follow-up visit. Patient educated on when it is appropriate to go to the emergency department.    I provided 13 minutes of non-face-to-face time during this encounter.    Worthy Rancher, MD

## 2020-02-06 ENCOUNTER — Other Ambulatory Visit: Payer: Commercial Managed Care - PPO

## 2020-02-06 ENCOUNTER — Other Ambulatory Visit: Payer: Self-pay

## 2020-02-06 DIAGNOSIS — E785 Hyperlipidemia, unspecified: Secondary | ICD-10-CM

## 2020-02-06 DIAGNOSIS — I10 Essential (primary) hypertension: Secondary | ICD-10-CM

## 2020-02-06 LAB — CBC WITH DIFFERENTIAL/PLATELET
Basophils Absolute: 0.1 10*3/uL (ref 0.0–0.2)
Basos: 1 %
EOS (ABSOLUTE): 0.2 10*3/uL (ref 0.0–0.4)
Eos: 2 %
Hematocrit: 46.2 % (ref 37.5–51.0)
Hemoglobin: 16.2 g/dL (ref 13.0–17.7)
Immature Grans (Abs): 0 10*3/uL (ref 0.0–0.1)
Immature Granulocytes: 0 %
Lymphocytes Absolute: 2.5 10*3/uL (ref 0.7–3.1)
Lymphs: 28 %
MCH: 31 pg (ref 26.6–33.0)
MCHC: 35.1 g/dL (ref 31.5–35.7)
MCV: 89 fL (ref 79–97)
Monocytes Absolute: 0.7 10*3/uL (ref 0.1–0.9)
Monocytes: 8 %
Neutrophils Absolute: 5.4 10*3/uL (ref 1.4–7.0)
Neutrophils: 61 %
Platelets: 201 10*3/uL (ref 150–450)
RBC: 5.22 x10E6/uL (ref 4.14–5.80)
RDW: 12.5 % (ref 11.6–15.4)
WBC: 8.8 10*3/uL (ref 3.4–10.8)

## 2020-02-06 LAB — LIPID PANEL
Chol/HDL Ratio: 4.3 ratio (ref 0.0–5.0)
Cholesterol, Total: 150 mg/dL (ref 100–199)
HDL: 35 mg/dL — ABNORMAL LOW (ref >39)
LDL Chol Calc (NIH): 93 mg/dL (ref 0–99)
Triglycerides: 118 mg/dL (ref 0–149)
VLDL Cholesterol Cal: 22 mg/dL (ref 5–40)

## 2020-02-06 LAB — CMP14+EGFR
ALT: 48 IU/L — ABNORMAL HIGH (ref 0–44)
AST: 23 IU/L (ref 0–40)
Albumin/Globulin Ratio: 2.1 (ref 1.2–2.2)
Albumin: 4.9 g/dL (ref 4.0–5.0)
Alkaline Phosphatase: 99 IU/L (ref 39–117)
BUN/Creatinine Ratio: 16 (ref 9–20)
BUN: 14 mg/dL (ref 6–20)
Bilirubin Total: 0.5 mg/dL (ref 0.0–1.2)
CO2: 25 mmol/L (ref 20–29)
Calcium: 10 mg/dL (ref 8.7–10.2)
Chloride: 100 mmol/L (ref 96–106)
Creatinine, Ser: 0.89 mg/dL (ref 0.76–1.27)
GFR calc Af Amer: 125 mL/min/{1.73_m2} (ref >59)
GFR calc non Af Amer: 108 mL/min/{1.73_m2} (ref >59)
Globulin, Total: 2.3 g/dL (ref 1.5–4.5)
Glucose: 87 mg/dL (ref 65–99)
Potassium: 4.2 mmol/L (ref 3.5–5.2)
Sodium: 140 mmol/L (ref 134–144)
Total Protein: 7.2 g/dL (ref 6.0–8.5)

## 2020-04-08 IMAGING — XA Imaging study
2 series · 2 of 2 positions shown · non-contrast
Comparison: none

CLINICAL DATA: Lumbosacral spondylosis without myelopathy with
radiculopathy. Right lower extremity pain and numbness. Previous
multilevel lumbar laminectomies. Right-sided L5-S1 epidural
injection requested. Given prior surgery at this level, a
transforaminal injection will be performed.

[Series 1: ortho adipose · 1 of 1 slices shown (1 of 2)]
[im 1/1]
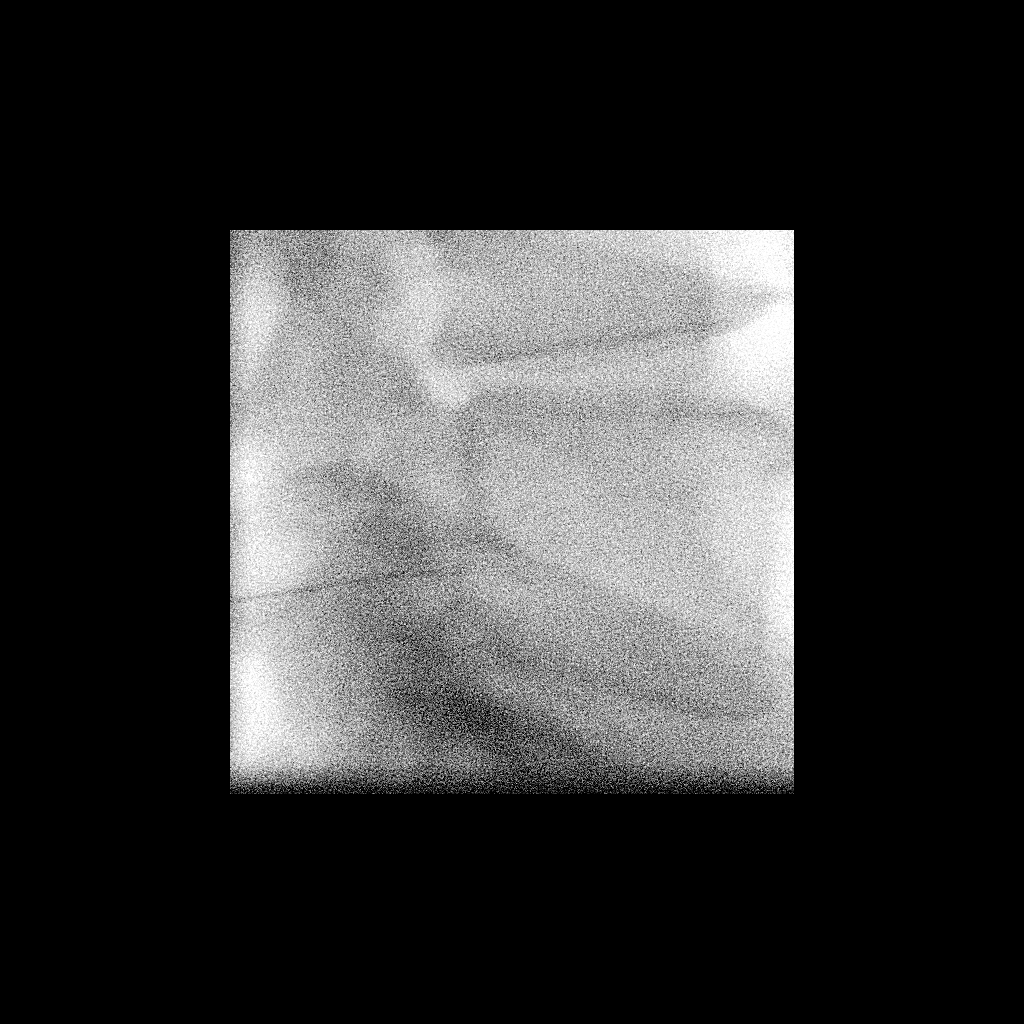

[Series 2: ortho adipose · 1 of 1 slices shown (2 of 2)]
[im 1/1]
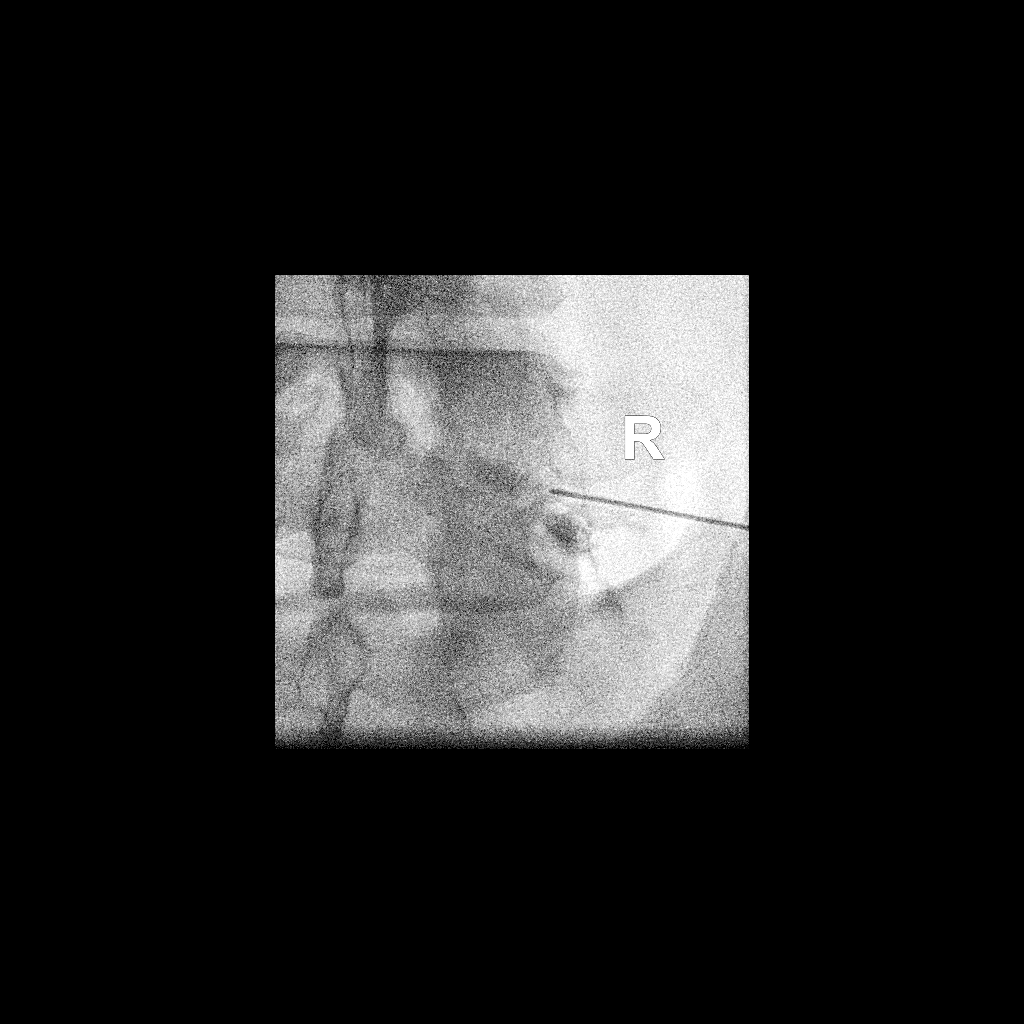

[2 of 2 positions shown; findings below may reference images not displayed]

EXAM:
DG INJECT/FLORAL INC NEEDLE/CHICA/DAINEKO EPI/ANANKO/SAC W/IMG

FLUOROSCOPY TIME:  Fluoroscopy Time: 23 seconds

Radiation Exposure Index: 25.38 microGray*m^2

PROCEDURE:
The procedure, risks, benefits, and alternatives were explained to
the patient. Questions regarding the procedure were encouraged and
answered. The patient understands and consents to the procedure.

RIGHT L5 NERVE ROOT BLOCK AND TRANSFORAMINAL EPIDURAL: A posterior
oblique approach was taken to the intervertebral foramen on the
right at L5-S1 using a curved 5 inch 22 gauge spinal needle.
Injection of Isovue-M 200 outlined the right L5 nerve root and
showed good epidural spread. No vascular opacification is seen. 120
mg of Depo-Medrol mixed with 2 mL of 1% lidocaine were instilled.
The procedure was well-tolerated, and the patient was discharged
thirty minutes following the injection in good condition.

COMPLICATIONS:
None
IMPRESSION: Technically successful injection consisting of a right L5 nerve root
block and transforaminal epidural.

## 2020-12-15 ENCOUNTER — Other Ambulatory Visit: Payer: Self-pay

## 2020-12-15 ENCOUNTER — Ambulatory Visit: Payer: Commercial Managed Care - PPO | Admitting: Family Medicine

## 2020-12-15 ENCOUNTER — Encounter: Payer: Self-pay | Admitting: Family Medicine

## 2020-12-15 VITALS — BP 144/86 | HR 107 | Temp 98.4°F | Ht 73.0 in | Wt 213.0 lb

## 2020-12-15 DIAGNOSIS — I10 Essential (primary) hypertension: Secondary | ICD-10-CM

## 2020-12-15 DIAGNOSIS — E785 Hyperlipidemia, unspecified: Secondary | ICD-10-CM

## 2020-12-15 DIAGNOSIS — E663 Overweight: Secondary | ICD-10-CM

## 2020-12-15 LAB — LIPID PANEL

## 2020-12-15 MED ORDER — METOPROLOL SUCCINATE ER 25 MG PO TB24: 25.0000 mg | ORAL_TABLET | Freq: Every day | ORAL | 3 refills | Status: DC

## 2020-12-15 MED ORDER — ATORVASTATIN CALCIUM 20 MG PO TABS: 20.0000 mg | ORAL_TABLET | Freq: Every day | ORAL | 3 refills | Status: DC

## 2020-12-15 MED ORDER — LISINOPRIL 10 MG PO TABS: 10.0000 mg | ORAL_TABLET | Freq: Every day | ORAL | 3 refills | Status: DC

## 2020-12-15 NOTE — Progress Notes (Signed)
BP (!) 144/86   Pulse (!) 107   Temp 98.4 F (36.9 C) (Temporal)   Ht _0  (1.854 m)   Wt 213 lb (96.6 kg)   SpO2 95%   BMI 28.10 kg/m    Subjective:   Patient ID: Thomas York, male    DOB: [birth year], 40 y.o.   MRN: [id number]  HPI: Thomas York is a 40 y.o. male presenting on 12/15/2020 for Hypertension (Check up )   HPI Hypertension Patient is currently on lisinopril, and their blood pressure today is 144/86. Patient denies any lightheadedness or dizziness. Patient denies headaches, blurred vision, chest pains, shortness of breath, or weakness. Denies any side effects from medication and is content with current medication.   Hyperlipidemia Patient is coming in for recheck of his hyperlipidemia. The patient is currently taking Lipitor. They deny any issues with myalgias or history of liver damage from it. They deny any focal numbness or weakness or chest pain.   Overweight Patient is weight but has been is going in the right direction his BMI is down to 28  Relevant past medical, surgical, family and social history reviewed and updated as indicated. Interim medical history since our last visit reviewed. Allergies and medications reviewed and updated.  Review of Systems  Constitutional: Negative for chills and fever.  Eyes: Negative for visual disturbance.  Respiratory: Negative for shortness of breath and wheezing.   Cardiovascular: Negative for chest pain and leg swelling.  Musculoskeletal: Negative for back pain and gait problem.  Skin: Negative for rash.  Neurological: Negative for dizziness, weakness and light-headedness.  All other systems reviewed and are negative.   Per HPI unless specifically indicated above   Allergies as of 12/15/2020      Reactions   Penicillins Hives, Swelling      Medication List       Accurate as of December 15, 2020 11:22 AM. If you have any questions, ask your nurse or doctor.        atorvastatin 20 MG tablet Commonly  known as: LIPITOR Take 1 tablet (20 mg total) by mouth daily.   lisinopril 10 MG tablet Commonly known as: ZESTRIL Take 1 tablet (10 mg total) by mouth daily. Needs to be seen for further refills.   metoprolol succinate 25 MG 24 hr tablet Commonly known as: TOPROL-XL Take 1 tablet (25 mg total) by mouth daily. Started by: Fransisca Kaufmann Amariya Liskey, MD        Objective:   BP (!) 144/86   Pulse (!) 107   Temp 98.4 F (36.9 C) (Temporal)   Ht _1  (1.854 m)   Wt 213 lb (96.6 kg)   SpO2 95%   BMI 28.10 kg/m   Wt Readings from Last 3 Encounters:  12/15/20 213 lb (96.6 kg)  10/26/19 215 lb (97.5 kg)  10/26/19 216 lb (98 kg)    Physical Exam Vitals and nursing note reviewed.  Constitutional:      General: He is not in acute distress.    Appearance: He is well-developed and well-nourished. He is not diaphoretic.  Eyes:     General: No scleral icterus.    Extraocular Movements: EOM normal.     Conjunctiva/sclera: Conjunctivae normal.  Neck:     Thyroid: No thyromegaly.  Cardiovascular:     Rate and Rhythm: Normal rate and regular rhythm.     Pulses: Intact distal pulses.     Heart sounds: Normal heart sounds. No murmur heard.   Pulmonary:  Effort: Pulmonary effort is normal. No respiratory distress.     Breath sounds: Normal breath sounds. No wheezing.  Musculoskeletal:        General: No edema. Normal range of motion.     Cervical back: Neck supple.  Lymphadenopathy:     Cervical: No cervical adenopathy.  Skin:    General: Skin is warm and dry.     Findings: No rash.  Neurological:     Mental Status: He is alert and oriented to person, place, and time.     Coordination: Coordination normal.  Psychiatric:        Mood and Affect: Mood and affect normal.        Behavior: Behavior normal.       Assessment & Plan:   Problem List Items Addressed This Visit      Cardiovascular and Mediastinum   Essential hypertension, benign   Relevant Medications    lisinopril (ZESTRIL) 10 MG tablet   atorvastatin (LIPITOR) 20 MG tablet   metoprolol succinate (TOPROL-XL) 25 MG 24 hr tablet   Other Relevant Orders   CBC with Differential/Platelet   CMP14+EGFR     Other   Hyperlipidemia LDL goal <130   Relevant Medications   lisinopril (ZESTRIL) 10 MG tablet   atorvastatin (LIPITOR) 20 MG tablet   metoprolol succinate (TOPROL-XL) 25 MG 24 hr tablet   Other Relevant Orders   Lipid panel   Overweight (BMI 25.0-29.9) - Primary      Will add metoprolol to help a little bit with his nerves and help with his blood pressure. Follow up plan: Return in about 6 months (around 06/14/2021), or if symptoms worsen or fail to improve, for Hypertension and cholesterol and physical.  Counseling provided for all of the vaccine components Orders Placed This Encounter  Procedures  . CBC with Differential/Platelet  . CMP14+EGFR  . Lipid panel    Caryl Pina, MD [facility] 12/15/2020, 11:22 AM

## 2020-12-16 LAB — CMP14+EGFR
ALT: 55 IU/L — ABNORMAL HIGH (ref 0–44)
AST: 26 IU/L (ref 0–40)
Albumin/Globulin Ratio: 1.9 (ref 1.2–2.2)
Albumin: 5.3 g/dL — ABNORMAL HIGH (ref 4.0–5.0)
Alkaline Phosphatase: 109 IU/L (ref 44–121)
BUN/Creatinine Ratio: 19 (ref 9–20)
BUN: 19 mg/dL (ref 6–20)
Bilirubin Total: 0.4 mg/dL (ref 0.0–1.2)
CO2: 24 mmol/L (ref 20–29)
Calcium: 10.4 mg/dL — ABNORMAL HIGH (ref 8.7–10.2)
Chloride: 97 mmol/L (ref 96–106)
Creatinine, Ser: 1.01 mg/dL (ref 0.76–1.27)
GFR calc Af Amer: 108 mL/min/{1.73_m2} (ref >59)
GFR calc non Af Amer: 93 mL/min/{1.73_m2} (ref >59)
Globulin, Total: 2.8 g/dL (ref 1.5–4.5)
Glucose: 93 mg/dL (ref 65–99)
Potassium: 4.5 mmol/L (ref 3.5–5.2)
Sodium: 139 mmol/L (ref 134–144)
Total Protein: 8.1 g/dL (ref 6.0–8.5)

## 2020-12-16 LAB — CBC WITH DIFFERENTIAL/PLATELET
Basophils Absolute: 0.1 10*3/uL (ref 0.0–0.2)
Basos: 1 %
EOS (ABSOLUTE): 0.1 10*3/uL (ref 0.0–0.4)
Eos: 0 %
Hematocrit: 53.6 % — ABNORMAL HIGH (ref 37.5–51.0)
Hemoglobin: 18.5 g/dL — ABNORMAL HIGH (ref 13.0–17.7)
Immature Grans (Abs): 0.1 10*3/uL (ref 0.0–0.1)
Immature Granulocytes: 1 %
Lymphocytes Absolute: 3 10*3/uL (ref 0.7–3.1)
Lymphs: 20 %
MCH: 30.2 pg (ref 26.6–33.0)
MCHC: 34.5 g/dL (ref 31.5–35.7)
MCV: 87 fL (ref 79–97)
Monocytes Absolute: 1.1 10*3/uL — ABNORMAL HIGH (ref 0.1–0.9)
Monocytes: 8 %
Neutrophils Absolute: 10.7 10*3/uL — ABNORMAL HIGH (ref 1.4–7.0)
Neutrophils: 70 %
Platelets: 296 10*3/uL (ref 150–450)
RBC: 6.13 x10E6/uL — ABNORMAL HIGH (ref 4.14–5.80)
RDW: 11.9 % (ref 11.6–15.4)
WBC: 15 10*3/uL — ABNORMAL HIGH (ref 3.4–10.8)

## 2020-12-16 LAB — LIPID PANEL
Chol/HDL Ratio: 7.1 ratio — ABNORMAL HIGH (ref 0.0–5.0)
Cholesterol, Total: 250 mg/dL — ABNORMAL HIGH (ref 100–199)
HDL: 35 mg/dL — ABNORMAL LOW (ref >39)
LDL Chol Calc (NIH): 180 mg/dL — ABNORMAL HIGH (ref 0–99)
Triglycerides: 184 mg/dL — ABNORMAL HIGH (ref 0–149)
VLDL Cholesterol Cal: 35 mg/dL (ref 5–40)

## 2021-01-27 ENCOUNTER — Telehealth: Payer: Self-pay

## 2021-01-27 NOTE — Telephone Encounter (Signed)
Patient aware numbers are good to call us back if they are lower than that.

## 2021-01-28 ENCOUNTER — Telehealth: Payer: Self-pay

## 2021-02-02 ENCOUNTER — Ambulatory Visit: Payer: Commercial Managed Care - PPO | Admitting: Family

## 2021-02-02 ENCOUNTER — Encounter: Payer: Self-pay | Admitting: Family

## 2021-02-02 DIAGNOSIS — H538 Other visual disturbances: Secondary | ICD-10-CM

## 2021-02-02 NOTE — Progress Notes (Signed)
Virtual Visit via telephone Note Due to COVID-19 pandemic this visit was conducted virtually. This visit type was conducted due to national recommendations for restrictions regarding the COVID-19 Pandemic (e.g. social distancing, sheltering in place) in an effort to limit this patient's exposure and mitigate transmission in our community. All issues noted in this document were discussed and addressed.  A physical exam was not performed with this format.  I connected with Thomas York on 02/02/21 at 12:19 pm  by telephone and verified that I am speaking with the correct person using two identifiers. Thomas York is currently located at home and no one is currently with him  during visit. The provider, Jannifer Rodney, FNP is located in their office at time of visit.  I discussed the limitations, risks, security and privacy concerns of performing an evaluation and management service by telephone and the availability of in person appointments. I also discussed with the patient that there may be a patient responsible charge related to this service. The patient expressed understanding and agreed to proceed.   History and Present Illness:  HPI  Pt complaining of blurred vision over the last 3 weeks that comes and goes. He states he feels like this has slightly worsen. Denies fevers, double vision, headaches, sinus issues, changes in memory, changes in gait or speech.    He had his eye examined 3 weeks ago and had to get new glasses.  He reports this is when his blurred vision started. He states he went back and was told "his eyes were fine" and only had to wear his glasses as needed.   He states wearing his glasses his blurred vision is improved. However, when he takes his glasses off his vision is blurred. He is worried, because prior to this 3 weeks his "vision was good".   He states he decreased his metoprolol to 12.5 mg from 25 mg because he thought this might be causing the blurred vision.    He also thought it could be caused from his gabapentin and stopped this. He states his vision did not improve.    Review of Systems  Eyes: Positive for blurred vision and photophobia. Negative for double vision, pain, discharge and redness.  All other systems reviewed and are negative.    Observations/Objective: No SOB or distress noted   Assessment and Plan: 1. Blurred vision Recommend following up with ophthalmologist Continue to wear your glasses Will hold off on any CT since negative for headaches, changes in gait, changes in speech, or double vision Lab work reviewed from 12/15/20. Glucose stable at that time.  Follow up with PCP as needed        I discussed the assessment and treatment plan with the patient. The patient was provided an opportunity to ask questions and all were answered. The patient agreed with the plan and demonstrated an understanding of the instructions.   The patient was advised to call back or seek an in-person evaluation if the symptoms worsen or if the condition fails to improve as anticipated.  The above assessment and management plan was discussed with the patient. The patient verbalized understanding of and has agreed to the management plan. Patient is aware to call the clinic if symptoms persist or worsen. Patient is aware when to return to the clinic for a follow-up visit. Patient educated on when it is appropriate to go to the emergency department.   Time call ended:  12:32pm   I provided 13 minutes of non-face-to-face  time during this encounter.    Evelina Dun, FNP

## 2021-02-03 ENCOUNTER — Telehealth: Payer: Self-pay

## 2021-02-03 NOTE — Telephone Encounter (Signed)
Dr. Louanne Skye,  Pt is taking Metoprolol for BP but you instructed during last OV that it should help with his nerves.  Can you recommend something now or does he need to come back in for a visit?

## 2021-02-04 NOTE — Telephone Encounter (Signed)
Called patient, busy signal only 

## 2021-02-04 NOTE — Telephone Encounter (Signed)
Have him double it, take 2 per day

## 2021-02-05 ENCOUNTER — Encounter: Payer: Self-pay | Admitting: Nurse Practitioner

## 2021-02-05 ENCOUNTER — Other Ambulatory Visit: Payer: Self-pay

## 2021-02-05 ENCOUNTER — Ambulatory Visit: Payer: Commercial Managed Care - PPO | Admitting: Nurse Practitioner

## 2021-02-05 VITALS — BP 140/85 | HR 84 | Temp 97.9°F | Resp 20 | Ht 73.0 in | Wt 213.0 lb

## 2021-02-05 DIAGNOSIS — H539 Unspecified visual disturbance: Secondary | ICD-10-CM

## 2021-02-05 DIAGNOSIS — F419 Anxiety disorder, unspecified: Secondary | ICD-10-CM

## 2021-02-05 MED ORDER — HYDROXYZINE HCL 10 MG PO TABS: 10.0000 mg | ORAL_TABLET | Freq: Three times a day (TID) | ORAL | 0 refills | Status: DC | PRN

## 2021-02-05 MED ORDER — ESCITALOPRAM OXALATE 10 MG PO TABS: 10.0000 mg | ORAL_TABLET | Freq: Every day | ORAL | 5 refills | Status: DC

## 2021-02-05 NOTE — Progress Notes (Signed)
Subjective:    Patient ID: Thomas York, male    DOB: 03/09/1981, 40 y.o.   MRN: [id number]   Chief Complaint: Blurred Vision (Happens off and on. Started 1 month ago/)   HPI  Pt presents today with c/o intermittent blurred vision. He states it began about 3-4 weeks ago. He was watching television and noticed the guide was hard to read. This mainly happens when he is indoors. Has not been happening when he is driving and outside. He describes his vision changes as "fuzzy with a white background." Has had a few headaches during these episodes, but itdoes not happen every time. He states he has issues over worrying. He admits to having increased stress because of back pain. Has not tried anything to help relieve symptoms. He went to his optometrist 3 weeks ago who stated his vision was WNL.  Currently using his prescription lenses throughout the day.   Denies CP, SOB, migraines, muscle weakness, trouble swallowing, neck/shoulder pain during these episodes.   Denies ETOH; has not smoked marijuana in 2 weeks, denies any other secular drug use.    Review of Systems  Constitutional: Positive for appetite change. Negative for activity change, fatigue and fever.  Eyes: Positive for redness and itching.  Respiratory: Positive for chest tightness. Negative for cough and shortness of breath.        During panic attacks as recent as this morning.   Cardiovascular: Negative for chest pain and palpitations.  Gastrointestinal: Negative for abdominal pain, constipation and diarrhea.  Genitourinary: Negative for difficulty urinating and dysuria.  Neurological: Negative for dizziness, light-headedness and numbness.  Psychiatric/Behavioral: Positive for sleep disturbance. Negative for agitation. The patient is nervous/anxious. The patient is not hyperactive.    Vitals:   02/05/21 1028  BP: 140/85  Pulse: 84  Resp: 20  Temp: 97.9 F (36.6 C)  SpO2: 99%   Wt Readings from Last 3 Encounters:   02/05/21 213 lb (96.6 kg)  12/15/20 213 lb (96.6 kg)  10/26/19 215 lb (97.5 kg)   Depression screen [facility] 2/9 02/05/2021 02/05/2021 12/15/2020 10/26/2019 01/25/2019  Decreased Interest 1 0 0 0 0  Down, Depressed, Hopeless 2 0 0 0 0  PHQ - 2 Score 3 0 0 0 0  Altered sleeping 3 - - 0 -  Tired, decreased energy 0 - - 0 -  Change in appetite 2 - - 0 -  Feeling bad or failure about yourself  0 - - 0 -  Trouble concentrating 2 - - 0 -  Moving slowly or fidgety/restless 0 - - 0 -  Suicidal thoughts 0 - - 0 -  PHQ-9 Score 10 - - 0 -  Difficult doing work/chores Somewhat difficult - - - -       Objective:   Physical Exam Constitutional:      Appearance: Normal appearance.  Eyes:     Extraocular Movements: Extraocular movements intact.     Conjunctiva/sclera: Conjunctivae normal.     Pupils: Pupils are equal, round, and reactive to light.  Cardiovascular:     Rate and Rhythm: Normal rate and regular rhythm.     Pulses: Normal pulses.     Heart sounds: Normal heart sounds.  Pulmonary:     Effort: Pulmonary effort is normal.     Breath sounds: Normal breath sounds.  Abdominal:     General: Abdomen is flat.  Musculoskeletal:        General: Normal range of motion.     Cervical  back: Normal range of motion and neck supple.  Skin:    General: Skin is warm and dry.     Capillary Refill: Capillary refill takes less than 2 seconds.  Neurological:     Mental Status: He is oriented to person, place, and time.  Psychiatric:        Behavior: Behavior normal.        Thought Content: Thought content normal.        Judgment: Judgment normal.    BP 140/85   Pulse 84   Temp 97.9 F (36.6 C) (Temporal)   Resp 20   Ht 6\' 1"  (1.854 m)   Wt 213 lb (96.6 kg)   SpO2 99%   BMI 28.10 kg/m         Assessment & Plan:   AVYON HERENDEEN comes in today with chief complaint of Blurred Vision (Happens off and on. Started 1 month ago/)   Diagnosis and orders addressed:  1. Abnormal  vision Consulted with Dr. Toula Moos. Plan to treat anxiety symptoms and follow up for symptom check in 3 weeks.   2. Anxiety Begin Atarax. Take up to tid PRN. Begin escitalopram, one tab daily for symptoms of anxiety. Follow up with provider as scheduled to review symptom management. Ok to stop metoprolol if it is not helping.   - hydrOXYzine (ATARAX/VISTARIL) 10 MG tablet; Take 1 tablet (10 mg total) by mouth 3 (three) times daily as needed.  Dispense: 40 tablet; Refill: 0 - escitalopram (LEXAPRO) 10 MG tablet; Take 1 tablet (10 mg total) by mouth daily.  Dispense: 30 tablet; Refill: 5   Labs pending Health Maintenance reviewed Diet and exercise encouraged  Follow up plan: As scheduled with Dr. Louanne Skye  Louanne Skye, RN, BSN, FNP-Student   Mary-Margaret Oretha Milch, FNP

## 2021-02-05 NOTE — Patient Instructions (Signed)
[email or web address]">  Generalized Anxiety Disorder, Adult Generalized anxiety disorder (GAD) is a mental health condition. Unlike normal worries, anxiety related to GAD is not triggered by a specific event. These worries do not fade or get better with time. GAD interferes with relationships, work, and school. GAD symptoms can vary from mild to severe. People with severe GAD can have intense waves of anxiety with physical symptoms that are similar to panic attacks. What are the causes? The exact cause of GAD is not known, but the following are believed to have an impact:  Differences in natural brain chemicals.  Genes passed down from parents to children.  Differences in the way threats are perceived.  Development during childhood.  Personality. What increases the risk? The following factors may make you more likely to develop this condition:  Being male.  Having a family history of anxiety disorders.  Being very shy.  Experiencing very stressful life events, such as the death of a loved one.  Having a very stressful family environment. What are the signs or symptoms? People with GAD often worry excessively about many things in their lives, such as their health and family. Symptoms may also include:  Mental and emotional symptoms: ? Worrying excessively about natural disasters. ? Fear of being late. ? Difficulty concentrating. ? Fears that others are judging your performance.  Physical symptoms: ? Fatigue. ? Headaches, muscle tension, muscle twitches, trembling, or feeling shaky. ? Feeling like your heart is pounding or beating very fast. ? Feeling out of breath or like you cannot take a deep breath. ? Having trouble falling asleep or staying asleep, or experiencing restlessness. ? Sweating. ? Nausea, diarrhea, or irritable bowel syndrome (IBS).  Behavioral symptoms: ? Experiencing erratic moods or irritability. ? Avoidance of new situations. ? Avoidance of  people. ? Extreme difficulty making decisions. How is this diagnosed? This condition is diagnosed based on your symptoms and medical history. You will also have a physical exam. Your health care provider may perform tests to rule out other possible causes of your symptoms. To be diagnosed with GAD, a person must have anxiety that:  Is out of his or her control.  Affects several different aspects of his or her life, such as work and relationships.  Causes distress that makes him or her unable to take part in normal activities.  Includes at least three symptoms of GAD, such as restlessness, fatigue, trouble concentrating, irritability, muscle tension, or sleep problems. Before your health care provider can confirm a diagnosis of GAD, these symptoms must be present more days than they are not, and they must last for 6 months or longer. How is this treated? This condition may be treated with:  Medicine. Antidepressant medicine is usually prescribed for long-term daily control. Anti-anxiety medicines may be added in severe cases, especially when panic attacks occur.  Talk therapy (psychotherapy). Certain types of talk therapy can be helpful in treating GAD by providing support, education, and guidance. Options include: ? Cognitive behavioral therapy (CBT). People learn coping skills and self-calming techniques to ease their physical symptoms. They learn to identify unrealistic thoughts and behaviors and to replace them with more appropriate thoughts and behaviors. ? Acceptance and commitment therapy (ACT). This treatment teaches people how to be mindful as a way to cope with unwanted thoughts and feelings. ? Biofeedback. This process trains you to manage your body's response (physiological response) through breathing techniques and relaxation methods. You will work with a therapist while machines are used to monitor your physical   symptoms.  Stress management techniques. These include yoga,  meditation, and exercise. A mental health specialist can help determine which treatment is best for you. Some people see improvement with one type of therapy. However, other people require a combination of therapies.   Follow these instructions at home: Lifestyle  Maintain a consistent routine and schedule.  Anticipate stressful situations. Create a plan, and allow extra time to work with your plan.  Practice stress management or self-calming techniques that you have learned from your therapist or your health care provider. General instructions  Take over-the-counter and prescription medicines only as told by your health care provider.  Understand that you are likely to have setbacks. Accept this and be kind to yourself as you persist to take better care of yourself.  Recognize and accept your accomplishments, even if you judge them as small.  Keep all follow-up visits as told by your health care provider. This is important. Contact a health care provider if:  Your symptoms do not get better.  Your symptoms get worse.  You have signs of depression, such as: ? A persistently sad or irritable mood. ? Loss of enjoyment in activities that used to bring you joy. ? Change in weight or eating. ? Changes in sleeping habits. ? Avoiding friends or family members. ? Loss of energy for normal tasks. ? Feelings of guilt or worthlessness. Get help right away if:  You have serious thoughts about hurting yourself or others. If you ever feel like you may hurt yourself or others, or have thoughts about taking your own life, get help right away. Go to your nearest emergency department or:  Call your local emergency services (911 in the [place]  Call a suicide crisis helpline, such as the National Suicide Prevention Lifeline at 1-800-273-8255. This is open 24 hours a day in the [place]  Text the Crisis Text Line at 741741 (in the [place] Summary  Generalized anxiety disorder (GAD) is a mental  health condition that involves worry that is not triggered by a specific event.  People with GAD often worry excessively about many things in their lives, such as their health and family.  GAD may cause symptoms such as restlessness, trouble concentrating, sleep problems, frequent sweating, nausea, diarrhea, headaches, and trembling or muscle twitching.  A mental health specialist can help determine which treatment is best for you. Some people see improvement with one type of therapy. However, other people require a combination of therapies. This information is not intended to replace advice given to you by your health care provider. Make sure you discuss any questions you have with your health care provider. Document Revised: 09/19/2019 Document Reviewed: 09/19/2019 Elsevier Patient Education  2021 Elsevier Inc.  

## 2021-02-13 ENCOUNTER — Telehealth: Payer: Self-pay

## 2021-02-13 NOTE — Telephone Encounter (Signed)
Left message on pts cell to return call to the office.  Pt has follow up with Dettinger on 02/26/21

## 2021-02-13 NOTE — Telephone Encounter (Signed)
Spoke with pts mom. Pt is at work and is unable to speak with the office.  Pt seen Gennette Pac last week and she added Lexapro and Atarax. Pt will continue those meds along with keeping Metoprolol once daily.  No need to double Metoprolol at this time. Will discuss regimen with Dettinger on 3/17.

## 2021-02-13 NOTE — Telephone Encounter (Signed)
Left message for pt to return call.  Per Dettinger pt can start doubling his Metoprolol. So two per day.  Unable to leave detailed message on vmail.

## 2021-02-26 ENCOUNTER — Ambulatory Visit: Payer: Commercial Managed Care - PPO | Admitting: Family Medicine

## 2021-02-26 ENCOUNTER — Other Ambulatory Visit: Payer: Self-pay

## 2021-02-26 ENCOUNTER — Encounter: Payer: Self-pay | Admitting: Family Medicine

## 2021-02-26 VITALS — BP 135/76 | HR 102 | Ht 73.0 in | Wt 217.0 lb

## 2021-02-26 DIAGNOSIS — H538 Other visual disturbances: Secondary | ICD-10-CM | POA: Diagnosis not present

## 2021-02-26 DIAGNOSIS — F419 Anxiety disorder, unspecified: Secondary | ICD-10-CM | POA: Diagnosis not present

## 2021-02-26 MED ORDER — ESCITALOPRAM OXALATE 20 MG PO TABS: 20.0000 mg | ORAL_TABLET | Freq: Every day | ORAL | 5 refills | Status: DC

## 2021-02-26 NOTE — Progress Notes (Signed)
BP 135/76   Pulse (!) 102   Ht 6\' 1"  (1.854 m)   Wt 217 lb (98.4 kg)   SpO2 97%   BMI 28.63 kg/m    Subjective:   Patient ID: Thomas York, male    DOB: [birth year], 40 y.o.   MRN: [id number]  HPI: BURRELL HODAPP is a 40 y.o. male presenting on 02/26/2021 for Medical Management of Chronic Issues and Anxiety (Anxiety has improved some on Lexapro per pt//He continues to have blurred vision on and off. BP is elevated today. States that at times when eating lunch he will turn red in the face and feel hot. It does not matter what he is eating.)   HPI Anxiety and depression follow-up Patient is coming in for 4-week anxiety recheck.  He says that he has blurred vision and panic attack episodes that he has been having have been less frequent and less severe.  He did go get his vision checked out at an optometrist but has an appointment for another one.  They did give him some glasses but he felt it did not help and so he manages it with the other 1 to get them rechecked to see if anything else is going on.  Patient denies any suicidal ideations or thoughts of hurting self. Depression screen [facility] 2/9 02/26/2021 02/05/2021 02/05/2021 12/15/2020 10/26/2019  Decreased Interest 0 1 0 0 0  Down, Depressed, Hopeless 0 2 0 0 0  PHQ - 2 Score 0 3 0 0 0  Altered sleeping - 3 - - 0  Tired, decreased energy - 0 - - 0  Change in appetite - 2 - - 0  Feeling bad or failure about yourself  - 0 - - 0  Trouble concentrating - 2 - - 0  Moving slowly or fidgety/restless - 0 - - 0  Suicidal thoughts - 0 - - 0  PHQ-9 Score - 10 - - 0  Difficult doing work/chores - Somewhat difficult - - -     Relevant past medical, surgical, family and social history reviewed and updated as indicated. Interim medical history since our last visit reviewed. Allergies and medications reviewed and updated.  Review of Systems  Constitutional: Negative for chills and fever.  Eyes: Positive for visual disturbance.  Respiratory:  Negative for shortness of breath and wheezing.   Cardiovascular: Negative for chest pain and leg swelling.  Skin: Negative for rash.  Psychiatric/Behavioral: Negative for dysphoric mood, self-injury, sleep disturbance and suicidal ideas. The patient is nervous/anxious.   All other systems reviewed and are negative.   Per HPI unless specifically indicated above   Allergies as of 02/26/2021      Reactions   Penicillins Hives, Swelling      Medication List       Accurate as of February 26, 2021  4:05 PM. If you have any questions, ask your nurse or doctor.        STOP taking these medications   hydrOXYzine 10 MG tablet Commonly known as: ATARAX/VISTARIL Stopped by: February 28, 2021 Dettinger, MD     TAKE these medications   atorvastatin 20 MG tablet Commonly known as: LIPITOR Take 1 tablet (20 mg total) by mouth daily.   escitalopram 10 MG tablet Commonly known as: Lexapro Take 1 tablet (10 mg total) by mouth daily.   lisinopril 10 MG tablet Commonly known as: ZESTRIL Take 1 tablet (10 mg total) by mouth daily. Needs to be seen for further refills.   metoprolol succinate  25 MG 24 hr tablet Commonly known as: TOPROL-XL Take 1 tablet (25 mg total) by mouth daily.        Objective:   BP 135/76   Pulse (!) 102   Ht 6\' 1"  (1.854 m)   Wt 217 lb (98.4 kg)   SpO2 97%   BMI 28.63 kg/m   Wt Readings from Last 3 Encounters:  02/26/21 217 lb (98.4 kg)  02/05/21 213 lb (96.6 kg)  12/15/20 213 lb (96.6 kg)    Physical Exam Vitals and nursing note reviewed.  Constitutional:      General: He is not in acute distress.    Appearance: He is well-developed. He is not diaphoretic.  Eyes:     General: No scleral icterus.    Conjunctiva/sclera: Conjunctivae normal.  Neck:     Thyroid: No thyromegaly.  Cardiovascular:     Rate and Rhythm: Normal rate and regular rhythm.     Heart sounds: Normal heart sounds. No murmur heard.   Pulmonary:     Effort: Pulmonary effort is normal.  No respiratory distress.     Breath sounds: Normal breath sounds. No wheezing.  Musculoskeletal:        General: Normal range of motion.  Skin:    General: Skin is warm and dry.     Findings: No rash.  Neurological:     Mental Status: He is alert and oriented to person, place, and time.     Coordination: Coordination normal.  Psychiatric:        Mood and Affect: Mood is anxious. Mood is not depressed.        Behavior: Behavior normal.        Thought Content: Thought content does not include suicidal ideation. Thought content does not include suicidal plan.       Assessment & Plan:   Problem List Items Addressed This Visit   None   Visit Diagnoses    Anxiety    -  Primary   Relevant Medications   escitalopram (LEXAPRO) 20 MG tablet   Blurred vision          Patient feels like he is doing better on the Lexapro but still having some occasional anxiety attacks with vision issues, will increase Lexapro to 20 mg. Follow up plan: Return if symptoms worsen or fail to improve, for 4-week anxiety follow-up.  Counseling provided for all of the vaccine components No orders of the defined types were placed in this encounter.   02/12/21, MD [facility] 02/26/2021, 4:05 PM

## 2021-03-23 ENCOUNTER — Other Ambulatory Visit: Payer: Self-pay | Admitting: Family Medicine

## 2021-03-23 DIAGNOSIS — F419 Anxiety disorder, unspecified: Secondary | ICD-10-CM

## 2021-03-26 ENCOUNTER — Ambulatory Visit: Payer: Commercial Managed Care - PPO | Admitting: Family Medicine

## 2021-03-26 ENCOUNTER — Encounter: Payer: Self-pay | Admitting: Family Medicine

## 2021-03-26 ENCOUNTER — Other Ambulatory Visit: Payer: Self-pay

## 2021-03-26 VITALS — BP 134/81 | HR 84 | Ht 73.0 in | Wt 225.0 lb

## 2021-03-26 DIAGNOSIS — F419 Anxiety disorder, unspecified: Secondary | ICD-10-CM

## 2021-03-26 NOTE — Progress Notes (Signed)
BP 134/81   Pulse 84   Ht 6\' 1"  (1.854 m)   Wt 225 lb (102.1 kg)   SpO2 99%   BMI 29.69 kg/m    Subjective:   Patient ID: Thomas York, male    DOB: Oct 24, 1981, 40 y.o.   MRN: [id number]  HPI: Thomas York is a 40 y.o. male presenting on 03/26/2021 for Medical Management of Chronic Issues and Anxiety (Much improved since last visit. Pt states that his vision has improved as well)   HPI Patient is doing very well and anxiety.  He feels like the medicine is doing a lot better and is helping his vision and not having a panic attacks.  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  He denies any suicidal ideations or thoughts of hurting self. Depression screen [facility] 2/9 03/26/2021 02/26/2021 02/05/2021 02/05/2021 12/15/2020  Decreased Interest 0 0 1 0 0  Down, Depressed, Hopeless 0 0 2 0 0  PHQ - 2 Score 0 0 3 0 0  Altered sleeping - - 3 - -  Tired, decreased energy - - 0 - -  Change in appetite - - 2 - -  Feeling bad or failure about yourself  - - 0 - -  Trouble concentrating - - 2 - -  Moving slowly or fidgety/restless - - 0 - -  Suicidal thoughts - - 0 - -  PHQ-9 Score - - 10 - -  Difficult doing work/chores - - Somewhat difficult - -     Relevant past medical, surgical, family and social history reviewed and updated as indicated. Interim medical history since our last visit reviewed. Allergies and medications reviewed and updated.  Review of Systems  Constitutional: Negative for chills and fever.  Eyes: Negative for visual disturbance.  Respiratory: Negative for shortness of breath and wheezing.   Cardiovascular: Negative for chest pain and leg swelling.  Musculoskeletal: Negative for back pain and gait problem.  Skin: Negative for rash.  Neurological: Negative for dizziness, weakness and light-headedness.  Psychiatric/Behavioral: Negative for decreased concentration, dysphoric mood, self-injury, sleep disturbance and suicidal ideas. The patient is not  nervous/anxious.   All other systems reviewed and are negative.   Per HPI unless specifically indicated above   Allergies as of 03/26/2021      Reactions   Penicillins Hives, Swelling      Medication List       Accurate as of March 26, 2021  4:03 PM. If you have any questions, ask your nurse or doctor.        atorvastatin 20 MG tablet Commonly known as: LIPITOR Take 1 tablet (20 mg total) by mouth daily.   escitalopram 20 MG tablet Commonly known as: LEXAPRO TAKE 1 TABLET BY MOUTH EVERY DAY   gabapentin 300 MG capsule Commonly known as: NEURONTIN Take 1 capsule by mouth at bedtime.   HYDROcodone-acetaminophen 10-325 MG tablet Commonly known as: NORCO Take 1 tablet by mouth 3 (three) times daily as needed. Pt states that he takes 1/2 tablet daily   lisinopril 10 MG tablet Commonly known as: ZESTRIL Take 1 tablet (10 mg total) by mouth daily. Needs to be seen for further refills.   metoprolol succinate 25 MG 24 hr tablet Commonly known as: TOPROL-XL Take 1 tablet (25 mg total) by mouth daily.        Objective:   BP 134/81   Pulse 84   Ht 6\' 1"  (1.854 m)   Wt 225 lb (  102.1 kg)   SpO2 99%   BMI 29.69 kg/m   Wt Readings from Last 3 Encounters:  03/26/21 225 lb (102.1 kg)  02/26/21 217 lb (98.4 kg)  02/05/21 213 lb (96.6 kg)    Physical Exam Vitals and nursing note reviewed.  Constitutional:      General: He is not in acute distress.    Appearance: He is well-developed. He is not diaphoretic.  Eyes:     General: No scleral icterus.    Conjunctiva/sclera: Conjunctivae normal.  Neck:     Thyroid: No thyromegaly.  Cardiovascular:     Rate and Rhythm: Normal rate and regular rhythm.     Heart sounds: Normal heart sounds. No murmur heard.   Pulmonary:     Effort: Pulmonary effort is normal. No respiratory distress.     Breath sounds: Normal breath sounds. No wheezing.  Musculoskeletal:     Cervical back: Neck supple.  Lymphadenopathy:      Cervical: No cervical adenopathy.  Skin:    General: Skin is warm and dry.     Findings: No rash.  Neurological:     Mental Status: He is alert and oriented to person, place, and time.     Coordination: Coordination normal.  Psychiatric:        Behavior: Behavior normal.       Assessment & Plan:   Problem List Items Addressed This Visit      Other   Anxiety - Primary      Continue current medication for anxiety with the Lexapro 20, seems to be doing well.  No changes. Follow up plan: Return if symptoms worsen or fail to improve, for 3 to 70-month follow-up for anxiety and depression.  Counseling provided for all of the vaccine components No orders of the defined types were placed in this encounter.   Arville Care, MD [facility] 03/26/2021, 4:03 PM

## 2021-06-17 ENCOUNTER — Other Ambulatory Visit: Payer: Self-pay

## 2021-06-17 ENCOUNTER — Encounter: Payer: Self-pay | Admitting: Family Medicine

## 2021-06-17 ENCOUNTER — Ambulatory Visit (INDEPENDENT_AMBULATORY_CARE_PROVIDER_SITE_OTHER): Payer: Commercial Managed Care - PPO | Admitting: Family Medicine

## 2021-06-17 VITALS — BP 128/78 | HR 68 | Ht 73.0 in | Wt 235.0 lb

## 2021-06-17 DIAGNOSIS — I10 Essential (primary) hypertension: Secondary | ICD-10-CM

## 2021-06-17 DIAGNOSIS — E785 Hyperlipidemia, unspecified: Secondary | ICD-10-CM

## 2021-06-17 DIAGNOSIS — Z0000 Encounter for general adult medical examination without abnormal findings: Secondary | ICD-10-CM | POA: Diagnosis not present

## 2021-06-17 DIAGNOSIS — F419 Anxiety disorder, unspecified: Secondary | ICD-10-CM

## 2021-06-17 LAB — CMP14+EGFR
ALT: 49 IU/L — ABNORMAL HIGH (ref 0–44)
AST: 24 IU/L (ref 0–40)
Albumin/Globulin Ratio: 1.8 (ref 1.2–2.2)
Albumin: 4.6 g/dL (ref 4.0–5.0)
Alkaline Phosphatase: 115 IU/L (ref 44–121)
BUN/Creatinine Ratio: 12 (ref 9–20)
BUN: 10 mg/dL (ref 6–20)
Bilirubin Total: 0.5 mg/dL (ref 0.0–1.2)
CO2: 24 mmol/L (ref 20–29)
Calcium: 9.1 mg/dL (ref 8.7–10.2)
Chloride: 101 mmol/L (ref 96–106)
Creatinine, Ser: 0.83 mg/dL (ref 0.76–1.27)
Globulin, Total: 2.5 g/dL (ref 1.5–4.5)
Glucose: 99 mg/dL (ref 65–99)
Potassium: 4.4 mmol/L (ref 3.5–5.2)
Sodium: 139 mmol/L (ref 134–144)
Total Protein: 7.1 g/dL (ref 6.0–8.5)
eGFR: 114 mL/min/{1.73_m2} (ref >59)

## 2021-06-17 LAB — CBC WITH DIFFERENTIAL/PLATELET
Basophils Absolute: 0.1 10*3/uL (ref 0.0–0.2)
Basos: 1 %
EOS (ABSOLUTE): 0.5 10*3/uL — ABNORMAL HIGH (ref 0.0–0.4)
Eos: 5 %
Hematocrit: 47.2 % (ref 37.5–51.0)
Hemoglobin: 16.6 g/dL (ref 13.0–17.7)
Immature Grans (Abs): 0 10*3/uL (ref 0.0–0.1)
Immature Granulocytes: 0 %
Lymphocytes Absolute: 2.5 10*3/uL (ref 0.7–3.1)
Lymphs: 27 %
MCH: 31 pg (ref 26.6–33.0)
MCHC: 35.2 g/dL (ref 31.5–35.7)
MCV: 88 fL (ref 79–97)
Monocytes Absolute: 0.7 10*3/uL (ref 0.1–0.9)
Monocytes: 8 %
Neutrophils Absolute: 5.6 10*3/uL (ref 1.4–7.0)
Neutrophils: 59 %
Platelets: 199 10*3/uL (ref 150–450)
RBC: 5.35 x10E6/uL (ref 4.14–5.80)
RDW: 12.2 % (ref 11.6–15.4)
WBC: 9.4 10*3/uL (ref 3.4–10.8)

## 2021-06-17 LAB — LIPID PANEL
Chol/HDL Ratio: 6 ratio — ABNORMAL HIGH (ref 0.0–5.0)
Cholesterol, Total: 168 mg/dL (ref 100–199)
HDL: 28 mg/dL — ABNORMAL LOW (ref >39)
LDL Chol Calc (NIH): 63 mg/dL (ref 0–99)
Triglycerides: 505 mg/dL — ABNORMAL HIGH (ref 0–149)
VLDL Cholesterol Cal: 77 mg/dL — ABNORMAL HIGH (ref 5–40)

## 2021-06-17 MED ORDER — LISINOPRIL 10 MG PO TABS: 10.0000 mg | ORAL_TABLET | Freq: Every day | ORAL | 3 refills | Status: DC

## 2021-06-17 MED ORDER — ESCITALOPRAM OXALATE 20 MG PO TABS: 20.0000 mg | ORAL_TABLET | Freq: Every day | ORAL | 3 refills | Status: DC

## 2021-06-17 MED ORDER — METOPROLOL SUCCINATE ER 25 MG PO TB24: 25.0000 mg | ORAL_TABLET | Freq: Every day | ORAL | 3 refills | Status: DC

## 2021-06-17 MED ORDER — ATORVASTATIN CALCIUM 20 MG PO TABS: 20.0000 mg | ORAL_TABLET | Freq: Every day | ORAL | 3 refills | Status: DC

## 2021-06-17 NOTE — Progress Notes (Signed)
BP 128/78   Pulse 68   Ht _0  (1.854 m)   Wt 235 lb (106.6 kg)   SpO2 98%   BMI 31.00 kg/m    Subjective:   Patient ID: Thomas York, male    DOB: 1981/11/01, 40 y.o.   MRN: [id number]  HPI: Thomas York is a 40 y.o. male presenting on 06/17/2021 for Medical Management of Chronic Issues, Hyperlipidemia, Hypertension, and Anxiety   HPI Physical exam Patient denies any chest pain, shortness of breath, headaches or vision issues, abdominal complaints, diarrhea, nausea, vomiting, or joint issues.  No major issues, feels like things are going well except with occasional joint aches  Hypertension Patient is currently on lisinopril and metoprolol, and their blood pressure today is 128/78. Patient denies any lightheadedness or dizziness. Patient denies headaches, blurred vision, chest pains, shortness of breath, or weakness. Denies any side effects from medication and is content with current medication.   Hyperlipidemia Patient is coming in for recheck of his hyperlipidemia. The patient is currently taking atorvastatin. They deny any issues with myalgias or history of liver damage from it. They deny any focal numbness or weakness or chest pain.   Anxiety recheck Patient is currently on Lexapro and feels like things are doing pretty well.  He is very content with his medicines and denies any major issues.  He denies any suicidal ideations.  He says his anxiety is under control. Depression screen [facility] 2/9 06/17/2021 03/26/2021 02/26/2021 02/05/2021 02/05/2021  Decreased Interest 0 0 0 1 0  Down, Depressed, Hopeless 0 0 0 2 0  PHQ - 2 Score 0 0 0 3 0  Altered sleeping - - - 3 -  Tired, decreased energy - - - 0 -  Change in appetite - - - 2 -  Feeling bad or failure about yourself  - - - 0 -  Trouble concentrating - - - 2 -  Moving slowly or fidgety/restless - - - 0 -  Suicidal thoughts - - - 0 -  PHQ-9 Score - - - 10 -  Difficult doing work/chores - - - Somewhat difficult -      Relevant past medical, surgical, family and social history reviewed and updated as indicated. Interim medical history since our last visit reviewed. Allergies and medications reviewed and updated.  Review of Systems  Constitutional:  Negative for chills and fever.  Eyes:  Negative for visual disturbance.  Respiratory:  Negative for shortness of breath and wheezing.   Cardiovascular:  Negative for chest pain and leg swelling.  Musculoskeletal:  Negative for back pain and gait problem.  Skin:  Negative for rash.  Neurological:  Negative for dizziness, weakness and numbness.  All other systems reviewed and are negative.  Per HPI unless specifically indicated above   Allergies as of 06/17/2021       Reactions   Penicillins Hives, Swelling, Rash        Medication List        Accurate as of June 17, 2021 10:03 AM. If you have any questions, ask your nurse or doctor.          atorvastatin 20 MG tablet Commonly known as: LIPITOR Take 1 tablet (20 mg total) by mouth daily.   escitalopram 20 MG tablet Commonly known as: LEXAPRO Take 1 tablet (20 mg total) by mouth daily.   gabapentin 300 MG capsule Commonly known as: NEURONTIN Take 1 capsule by mouth at bedtime.   HYDROcodone-acetaminophen 10-325 MG tablet Commonly  known as: NORCO Take 1 tablet by mouth 3 (three) times daily as needed. Pt states that he takes 1/2 tablet daily   lisinopril 10 MG tablet Commonly known as: ZESTRIL Take 1 tablet (10 mg total) by mouth daily. What changed: additional instructions Changed by: Fransisca Kaufmann Noah Lembke, MD   metoprolol succinate 25 MG 24 hr tablet Commonly known as: TOPROL-XL Take 1 tablet (25 mg total) by mouth daily.         Objective:   BP 128/78   Pulse 68   Ht _0  (1.854 m)   Wt 235 lb (106.6 kg)   SpO2 98%   BMI 31.00 kg/m   Wt Readings from Last 3 Encounters:  06/17/21 235 lb (106.6 kg)  03/26/21 225 lb (102.1 kg)  02/26/21 217 lb (98.4 kg)    Physical  Exam Vitals and nursing note reviewed.  Constitutional:      General: He is not in acute distress.    Appearance: He is well-developed. He is not diaphoretic.  HENT:     Right Ear: External ear normal.     Left Ear: External ear normal.     Nose: Nose normal.     Mouth/Throat:     Pharynx: No oropharyngeal exudate.  Eyes:     General: No scleral icterus.       Right eye: No discharge.     Conjunctiva/sclera: Conjunctivae normal.     Pupils: Pupils are equal, round, and reactive to light.  Neck:     Thyroid: No thyromegaly.  Cardiovascular:     Rate and Rhythm: Normal rate and regular rhythm.     Heart sounds: Normal heart sounds. No murmur heard. Pulmonary:     Effort: Pulmonary effort is normal. No respiratory distress.     Breath sounds: Normal breath sounds. No wheezing.  Abdominal:     General: Bowel sounds are normal. There is no distension.     Palpations: Abdomen is soft.     Tenderness: There is no abdominal tenderness. There is no guarding or rebound.  Genitourinary:    Comments: Patient declined, says he does not have any genital issues Musculoskeletal:        General: Normal range of motion.     Cervical back: Neck supple.  Lymphadenopathy:     Cervical: No cervical adenopathy.  Skin:    General: Skin is warm and dry.     Findings: No rash.  Neurological:     Mental Status: He is alert and oriented to person, place, and time.     Coordination: Coordination normal.  Psychiatric:        Behavior: Behavior normal.      Assessment & Plan:   Problem List Items Addressed This Visit       Cardiovascular and Mediastinum   Essential hypertension, benign   Relevant Medications   atorvastatin (LIPITOR) 20 MG tablet   lisinopril (ZESTRIL) 10 MG tablet   metoprolol succinate (TOPROL-XL) 25 MG 24 hr tablet     Other   Hyperlipidemia LDL goal <130   Relevant Medications   atorvastatin (LIPITOR) 20 MG tablet   lisinopril (ZESTRIL) 10 MG tablet   metoprolol  succinate (TOPROL-XL) 25 MG 24 hr tablet   Anxiety   Relevant Medications   escitalopram (LEXAPRO) 20 MG tablet   Other Visit Diagnoses     Well adult exam    -  Primary   Relevant Orders   CBC with Differential/Platelet   CMP14+EGFR   Lipid  panel       Patient seems to be doing well, will check blood work, no change in medication for today Follow up plan: Return in about 6 months (around 12/18/2021), or if symptoms worsen or fail to improve, for Hypertension and cholesterol anxiety recheck.  Counseling provided for all of the vaccine components Orders Placed This Encounter  Procedures   CBC with Differential/Platelet   CMP14+EGFR   Lipid panel    Caryl Pina, MD [facility] 06/17/2021, 10:03 AM

## 2021-10-30 ENCOUNTER — Ambulatory Visit (INDEPENDENT_AMBULATORY_CARE_PROVIDER_SITE_OTHER): Payer: Commercial Managed Care - PPO | Admitting: Nurse Practitioner

## 2021-10-30 ENCOUNTER — Encounter: Payer: Self-pay | Admitting: Nurse Practitioner

## 2021-10-30 DIAGNOSIS — J069 Acute upper respiratory infection, unspecified: Secondary | ICD-10-CM | POA: Insufficient documentation

## 2021-10-30 MED ORDER — FLUTICASONE PROPIONATE 50 MCG/ACT NA SUSP: 2.0000 | Freq: Every day | NASAL | 6 refills | Status: DC

## 2021-10-30 MED ORDER — PSEUDOEPH-BROMPHEN-DM 30-2-10 MG/5ML PO SYRP: 5.0000 mL | ORAL_SOLUTION | Freq: Four times a day (QID) | ORAL | 0 refills | Status: DC | PRN

## 2021-10-30 MED ORDER — PREDNISONE 10 MG (21) PO TBPK: ORAL_TABLET | ORAL | 0 refills | Status: DC

## 2021-10-30 NOTE — Progress Notes (Signed)
   Virtual Visit  Note Due to COVID-19 pandemic this visit was conducted virtually. This visit type was conducted due to national recommendations for restrictions regarding the COVID-19 Pandemic (e.g. social distancing, sheltering in place) in an effort to limit this patient's exposure and mitigate transmission in our community. All issues noted in this document were discussed and addressed.  A physical exam was not performed with this format.  I connected with Thomas York on 10/30/21 at 4:20 PM by telephone and verified that I am speaking with the correct person using two identifiers. Thomas York is currently located at home during visit. The provider, Daryll Drown, NP is located in their office at time of visit.  I discussed the limitations, risks, security and privacy concerns of performing an evaluation and management service by telephone and the availability of in person appointments. I also discussed with the patient that there may be a patient responsible charge related to this service. The patient expressed understanding and agreed to proceed.   History and Present Illness:  URI  This is a new problem. The current episode started yesterday. The problem has been unchanged. There has been no fever. Associated symptoms include congestion, coughing and sinus pain. Pertinent negatives include no rash or sore throat. He has tried nothing for the symptoms.     Review of Systems  Constitutional:  Positive for chills. Negative for fever and malaise/fatigue.  HENT:  Positive for congestion and sinus pain. Negative for sore throat.   Respiratory:  Positive for cough.   Skin:  Negative for rash.  All other systems reviewed and are negative.   Observations/Objective: Televisit patient not in distress.  Assessment and Plan: Patient reports he was exposed to RSV Take meds as prescribed - Use a cool mist humidifier  -Use saline nose sprays frequently -Force fluids -For fever or  aches or pains- take Tylenol or ibuprofen. -Bromfed 5 ml for cough and congestion -Prednisone taper -If symptoms do not improve, she may need to be COVID tested to rule this out Follow up with worsening unresolved symptoms   Follow Up Instructions: Follow-up with worsening unresolved symptoms.    I discussed the assessment and treatment plan with the patient. The patient was provided an opportunity to ask questions and all were answered. The patient agreed with the plan and demonstrated an understanding of the instructions.   The patient was advised to call back or seek an in-person evaluation if the symptoms worsen or if the condition fails to improve as anticipated.  The above assessment and management plan was discussed with the patient. The patient verbalized understanding of and has agreed to the management plan. Patient is aware to call the clinic if symptoms persist or worsen. Patient is aware when to return to the clinic for a follow-up visit. Patient educated on when it is appropriate to go to the emergency department.   Time call ended: 4:30 PM  I provided 10 minutes of  non face-to-face time during this encounter.    Daryll Drown, NP

## 2021-10-30 NOTE — Assessment & Plan Note (Addendum)
Patient reports he was exposed to RSV Take meds as prescribed - Use a cool mist humidifier  -Use saline nose sprays frequently -Force fluids -For fever or aches or pains- take Tylenol or ibuprofen. -Bromfed 5 ml for cough and congestion -Prednisone taper -If symptoms do not improve, she may need to be COVID tested to rule this out Follow up with worsening unresolved symptoms

## 2021-12-18 ENCOUNTER — Ambulatory Visit: Payer: Commercial Managed Care - PPO | Admitting: Family Medicine

## 2022-01-21 ENCOUNTER — Ambulatory Visit: Payer: Commercial Managed Care - PPO | Admitting: Family Medicine

## 2022-01-25 ENCOUNTER — Encounter: Payer: Self-pay | Admitting: Family Medicine

## 2022-03-30 ENCOUNTER — Telehealth: Payer: Commercial Managed Care - PPO | Admitting: Nurse Practitioner

## 2022-03-30 ENCOUNTER — Encounter: Payer: Self-pay | Admitting: Nurse Practitioner

## 2022-03-30 DIAGNOSIS — J0100 Acute maxillary sinusitis, unspecified: Secondary | ICD-10-CM

## 2022-03-30 MED ORDER — DOXYCYCLINE HYCLATE 100 MG PO TABS: 100.0000 mg | ORAL_TABLET | Freq: Two times a day (BID) | ORAL | 0 refills | Status: DC

## 2022-03-30 NOTE — Patient Instructions (Signed)
1. Take meds as prescribed ?2. Use a cool mist humidifier especially during the winter months and when heat has been humid. ?3. Use saline nose sprays frequently ?4. Saline irrigations of the nose can be very helpful if done frequently. ? * 4X daily for 1 week* ? * Use of a nettie pot can be helpful with this. Follow directions with this* ?5. Drink plenty of fluids ?6. Keep thermostat turn down low ?7.For any cough or congestion- mucinex plain ?8. For fever or aces or pains- take tylenol or ibuprofen appropriate for age and weight. ? * for fevers greater than 101 orally you may alternate ibuprofen and tylenol every  3 hours. ?  ? ?

## 2022-03-30 NOTE — Progress Notes (Signed)
? ?Virtual Visit Consent  ? ?Thomas York, you are scheduled for a virtual visit with Mary-Margaret Daphine Deutscher, FNP, a [facility] provider, today.   ?  ?Just as with appointments in the office, your consent must be obtained to participate.  Your consent will be active for this visit and any virtual visit you may have with one of our providers in the next 365 days.   ?  ?If you have a MyChart account, a copy of this consent can be sent to you electronically.  All virtual visits are billed to your insurance company just like a traditional visit in the office.   ? ?As this is a virtual visit, video technology does not allow for your provider to perform a traditional examination.  This may limit your provider's ability to fully assess your condition.  If your provider identifies any concerns that need to be evaluated in person or the need to arrange testing (such as labs, EKG, etc.), we will make arrangements to do so.   ?  ?Although advances in technology are sophisticated, we cannot ensure that it will always work on either your end or our end.  If the connection with a video visit is poor, the visit may have to be switched to a telephone visit.  With either a video or telephone visit, we are not always able to ensure that we have a secure connection.    ? ?I need to obtain your verbal consent now.   Are you willing to proceed with your visit today? YES ?  ?Thomas York has provided verbal consent on 03/30/2022 for a virtual visit (video or telephone). ?  ?Mary-Margaret Daphine Deutscher, FNP  ? ?Date: 03/30/2022 1:40 PM ? ? ?Virtual Visit via Video Note  ? ?I, Mary-Margaret Daphine Deutscher, connected with Thomas York ([id number], Jun 14, 1981) on 03/30/22 at 12:15 PM EDT by a video-enabled telemedicine application and verified that I am speaking with the correct person using two identifiers. ? ?Location: ?Patient: Virtual Visit Location Patient: Home ?Provider: Virtual Visit Location Provider: Mobile ?  ?I discussed the limitations of  evaluation and management by telemedicine and the availability of in person appointments. The patient expressed understanding and agreed to proceed.   ? ?History of Present Illness: ?Thomas York is a 41 y.o. who identifies as a male who was assigned male at birth, and is being seen today for sinusitis. ? ?HPI: URI  ?This is a new problem. Episode onset: 3days ago. The problem has been gradually worsening. There has been no fever. Associated symptoms include congestion, coughing, rhinorrhea, sinus pain and a sore throat. Pertinent negatives include no headaches. He has tried decongestant for the symptoms. The treatment provided mild relief.  ?Review of Systems  ?HENT:  Positive for congestion, rhinorrhea, sinus pain and sore throat.   ?Respiratory:  Positive for cough.   ?Neurological:  Negative for headaches.  ? ?Problems:  ?Patient Active Problem List  ? Diagnosis Date Noted  ? Upper respiratory tract infection 10/30/2021  ? Anxiety 03/26/2021  ? Overweight (BMI 25.0-29.9) 01/25/2019  ? Hyperlipidemia LDL goal <130 11/01/2016  ? Essential hypertension, benign 11/01/2016  ? Back pain 05/28/2015  ?  ?Allergies:  ?Allergies  ?Allergen Reactions  ? Penicillins Hives, Swelling and Rash  ? ?Medications:  ?Current Outpatient Medications:  ?  atorvastatin (LIPITOR) 20 MG tablet, Take 1 tablet (20 mg total) by mouth daily., Disp: 90 tablet, Rfl: 3 ?  brompheniramine-pseudoephedrine-DM 30-2-10 MG/5ML syrup, Take 5 mLs by mouth 4 (four)  times daily as needed., Disp: 120 mL, Rfl: 0 ?  escitalopram (LEXAPRO) 20 MG tablet, Take 1 tablet (20 mg total) by mouth daily., Disp: 90 tablet, Rfl: 3 ?  fluticasone (FLONASE) 50 MCG/ACT nasal spray, Place 2 sprays into both nostrils daily., Disp: 16 g, Rfl: 6 ?  gabapentin (NEURONTIN) 300 MG capsule, Take 1 capsule by mouth at bedtime., Disp: , Rfl:  ?  HYDROcodone-acetaminophen (NORCO) 10-325 MG tablet, Take 1 tablet by mouth 3 (three) times daily as needed. Pt states that he takes 1/2  tablet daily, Disp: , Rfl:  ?  lisinopril (ZESTRIL) 10 MG tablet, Take 1 tablet (10 mg total) by mouth daily., Disp: 90 tablet, Rfl: 3 ?  metoprolol succinate (TOPROL-XL) 25 MG 24 hr tablet, Take 1 tablet (25 mg total) by mouth daily., Disp: 90 tablet, Rfl: 3 ?  predniSONE (STERAPRED UNI-PAK 21 TAB) 10 MG (21) TBPK tablet, 6 tablet day 1, 5 tablet day 2, 4 tablet day day 3, 3 tablet day 4, 2 tablet day 5, 1 tablet day 6, Disp: 1 each, Rfl: 0 ? ?Observations/Objective: ?Patient is well-developed, well-nourished in no acute distress.  ?Resting comfortably  at home.  ?Head is normocephalic, atraumatic.  ?No labored breathing.  ?Speech is clear and coherent with logical content.  ?Patient is alert and oriented at baseline.  ?Raspy voice ?Dry cough ? ?Assessment and Plan: ? ?Thomas York in today with chief complaint of URI ? ? ?1. Acute non-recurrent maxillary sinusitis ?1. Take meds as prescribed ?2. Use a cool mist humidifier especially during the winter months and when heat has been humid. ?3. Use saline nose sprays frequently ?4. Saline irrigations of the nose can be very helpful if done frequently. ? * 4X daily for 1 week* ? * Use of a nettie pot can be helpful with this. Follow directions with this* ?5. Drink plenty of fluids ?6. Keep thermostat turn down low ?7.For any cough or congestion- mucinex- no decongestant ?8. For fever or aces or pains- take tylenol or ibuprofen appropriate for age and weight. ? * for fevers greater than 101 orally you may alternate ibuprofen and tylenol every  3 hours. ?  ?Meds ordered this encounter  ?Medications  ? doxycycline (VIBRA-TABS) 100 MG tablet  ?  Sig: Take 1 tablet (100 mg total) by mouth 2 (two) times daily. 1 po bid  ?  Dispense:  20 tablet  ?  Refill:  0  ?  Order Specific Question:   Supervising Provider  ?  Answer:   Arville Care A [[id number]]  ? ? ? ? ? ?Follow Up Instructions: ?I discussed the assessment and treatment plan with the patient. The patient was  provided an opportunity to ask questions and all were answered. The patient agreed with the plan and demonstrated an understanding of the instructions.  A copy of instructions were sent to the patient via MyChart. ? ?The patient was advised to call back or seek an in-person evaluation if the symptoms worsen or if the condition fails to improve as anticipated. ? ?Time:  ?I spent 8 minutes with the patient via telehealth technology discussing the above problems/concerns.   ? ?Mary-Margaret Daphine Deutscher, FNP ? ?

## 2022-07-02 ENCOUNTER — Other Ambulatory Visit: Payer: Self-pay | Admitting: Family Medicine

## 2022-07-02 DIAGNOSIS — I10 Essential (primary) hypertension: Secondary | ICD-10-CM

## 2022-07-08 ENCOUNTER — Other Ambulatory Visit: Payer: Self-pay | Admitting: Family Medicine

## 2022-07-08 DIAGNOSIS — I10 Essential (primary) hypertension: Secondary | ICD-10-CM

## 2022-07-09 ENCOUNTER — Other Ambulatory Visit: Payer: Self-pay

## 2022-07-09 ENCOUNTER — Telehealth: Payer: Self-pay | Admitting: Family Medicine

## 2022-07-09 DIAGNOSIS — I10 Essential (primary) hypertension: Secondary | ICD-10-CM

## 2022-07-09 DIAGNOSIS — F419 Anxiety disorder, unspecified: Secondary | ICD-10-CM

## 2022-07-09 DIAGNOSIS — E785 Hyperlipidemia, unspecified: Secondary | ICD-10-CM

## 2022-07-09 MED ORDER — LISINOPRIL 10 MG PO TABS: 10.0000 mg | ORAL_TABLET | Freq: Every day | ORAL | 0 refills | Status: DC

## 2022-07-09 MED ORDER — METOPROLOL SUCCINATE ER 25 MG PO TB24: 25.0000 mg | ORAL_TABLET | Freq: Every day | ORAL | 0 refills | Status: DC

## 2022-07-09 MED ORDER — ATORVASTATIN CALCIUM 20 MG PO TABS: 20.0000 mg | ORAL_TABLET | Freq: Every day | ORAL | 0 refills | Status: DC

## 2022-07-09 MED ORDER — ESCITALOPRAM OXALATE 20 MG PO TABS: 20.0000 mg | ORAL_TABLET | Freq: Every day | ORAL | 0 refills | Status: DC

## 2022-07-09 NOTE — Telephone Encounter (Signed)
#  30 of Metoprolol sent to [facility] in [place]. Pt made aware of this and to make sure he keeps his follow up on 8/28

## 2022-07-09 NOTE — Telephone Encounter (Signed)
  Prescription Request  07/09/2022  Is this a "Controlled Substance" medicine? NO  Have you seen your PCP in the last 2 weeks? No pt has appt on 08/09/2022 with Dr. Algis Downs.   If YES, route message to pool  -  If NO, patient needs to be scheduled for appointment.  What is the name of the medication or equipment? metoprolol succinate (TOPROL-XL) 25 MG 24 hr tablet   Have you contacted your pharmacy to request a refill? yes   Which pharmacy would you like this sent to? [facility] [place], pt is out of this medication    Patient notified that their request is being sent to the clinical staff for review and that they should receive a response within 2 business days.

## 2022-07-17 ENCOUNTER — Other Ambulatory Visit: Payer: Self-pay | Admitting: Family Medicine

## 2022-07-17 DIAGNOSIS — I10 Essential (primary) hypertension: Secondary | ICD-10-CM

## 2022-07-31 ENCOUNTER — Other Ambulatory Visit: Payer: Self-pay | Admitting: Family Medicine

## 2022-07-31 DIAGNOSIS — I10 Essential (primary) hypertension: Secondary | ICD-10-CM

## 2022-08-11 ENCOUNTER — Encounter: Payer: Self-pay | Admitting: Family Medicine

## 2022-08-11 ENCOUNTER — Ambulatory Visit: Payer: Commercial Managed Care - PPO | Admitting: Family Medicine

## 2022-08-11 VITALS — BP 126/73 | HR 79 | Temp 98.0°F | Ht 73.0 in | Wt 225.0 lb

## 2022-08-11 DIAGNOSIS — I10 Essential (primary) hypertension: Secondary | ICD-10-CM

## 2022-08-11 DIAGNOSIS — E785 Hyperlipidemia, unspecified: Secondary | ICD-10-CM | POA: Diagnosis not present

## 2022-08-11 DIAGNOSIS — F419 Anxiety disorder, unspecified: Secondary | ICD-10-CM | POA: Diagnosis not present

## 2022-08-11 DIAGNOSIS — Z23 Encounter for immunization: Secondary | ICD-10-CM | POA: Diagnosis not present

## 2022-08-11 MED ORDER — LISINOPRIL 10 MG PO TABS: 10.0000 mg | ORAL_TABLET | Freq: Every day | ORAL | 3 refills | Status: DC

## 2022-08-11 MED ORDER — ATORVASTATIN CALCIUM 20 MG PO TABS: 20.0000 mg | ORAL_TABLET | Freq: Every day | ORAL | 3 refills | Status: DC

## 2022-08-11 MED ORDER — ESCITALOPRAM OXALATE 20 MG PO TABS: 20.0000 mg | ORAL_TABLET | Freq: Every day | ORAL | 3 refills | Status: DC

## 2022-08-11 MED ORDER — METOPROLOL SUCCINATE ER 25 MG PO TB24: 25.0000 mg | ORAL_TABLET | Freq: Every day | ORAL | 3 refills | Status: DC

## 2022-08-11 NOTE — Progress Notes (Signed)
BP 126/73   Pulse 79   Temp 98 F (36.7 C)   Ht '6\' 1"'  (1.854 m)   Wt 225 lb (102.1 kg)   SpO2 97%   BMI 29.69 kg/m    Subjective:   Patient ID: Thomas York, male    DOB: 05/19/1981, 41 y.o.   MRN: [id number]  HPI: Thomas York is a 41 y.o. male presenting on 08/11/2022 for Medical Management of Chronic Issues, Hyperlipidemia, Hypertension, and Anxiety   HPI Anxiety recheck Patient is coming in today for anxiety recheck.  He is currently on Lexapro and feels like it is all right.  He still has some worrying and stress but he feels like is not overwhelming.  He denies any suicidal ideations    08/11/2022    1:28 PM 06/17/2021    9:18 AM 03/26/2021    3:45 PM 02/26/2021    3:51 PM 02/05/2021   10:47 AM  Depression screen PHQ 2/9  Decreased Interest 0 0 0 0 1  Down, Depressed, Hopeless 0 0 0 0 2  PHQ - 2 Score 0 0 0 0 3  Altered sleeping 0    3  Tired, decreased energy 1    0  Change in appetite 0    2  Feeling bad or failure about yourself  0    0  Trouble concentrating 0    2  Moving slowly or fidgety/restless 0    0  Suicidal thoughts 0    0  PHQ-9 Score 1    10  Difficult doing work/chores Somewhat difficult    Somewhat difficult    Hypertension Patient is currently on lisinopril and metoprolol, and their blood pressure today is 126/73. Patient denies any lightheadedness or dizziness. Patient denies headaches, blurred vision, chest pains, shortness of breath, or weakness. Denies any side effects from medication and is content with current medication.   Hyperlipidemia Patient is coming in for recheck of his hyperlipidemia. The patient is currently taking atorvastatin. They deny any issues with myalgias or history of liver damage from it. They deny any focal numbness or weakness or chest pain.   Relevant past medical, surgical, family and social history reviewed and updated as indicated. Interim medical history since our last visit reviewed. Allergies and medications  reviewed and updated.  Review of Systems  Constitutional:  Negative for chills and fever.  Eyes:  Negative for visual disturbance.  Respiratory:  Negative for shortness of breath and wheezing.   Cardiovascular:  Negative for chest pain and leg swelling.  Musculoskeletal:  Negative for back pain and gait problem.  Skin:  Negative for rash.  Neurological:  Negative for dizziness, weakness and light-headedness.  All other systems reviewed and are negative.   Per HPI unless specifically indicated above   Allergies as of 08/11/2022       Reactions   Penicillins Hives, Swelling, Rash        Medication List        Accurate as of August 11, 2022  1:37 PM. If you have any questions, ask your nurse or doctor.          STOP taking these medications    brompheniramine-pseudoephedrine-DM 30-2-10 MG/5ML syrup Stopped by: Worthy Rancher, MD   doxycycline 100 MG tablet Commonly known as: VIBRA-TABS Stopped by: Fransisca Kaufmann Daaiel Starlin, MD   gabapentin 300 MG capsule Commonly known as: NEURONTIN Stopped by: Fransisca Kaufmann Karthik Whittinghill, MD   predniSONE 10 MG (21) Tbpk tablet Commonly known  asArnetha Gula 21 TAB Stopped by: Worthy Rancher, MD       TAKE these medications    atorvastatin 20 MG tablet Commonly known as: LIPITOR Take 1 tablet (20 mg total) by mouth daily.   escitalopram 20 MG tablet Commonly known as: LEXAPRO Take 1 tablet (20 mg total) by mouth daily.   fluticasone 50 MCG/ACT nasal spray Commonly known as: FLONASE Place 2 sprays into both nostrils daily.   HYDROcodone-acetaminophen 10-325 MG tablet Commonly known as: NORCO Take 1 tablet by mouth 3 (three) times daily as needed. Pt states that he takes 1/2 tablet daily   lisinopril 10 MG tablet Commonly known as: ZESTRIL Take 1 tablet (10 mg total) by mouth daily.   metoprolol succinate 25 MG 24 hr tablet Commonly known as: TOPROL-XL Take 1 tablet (25 mg total) by mouth daily.          Objective:   BP 126/73   Pulse 79   Temp 98 F (36.7 C)   Ht '6\' 1"'  (1.854 m)   Wt 225 lb (102.1 kg)   SpO2 97%   BMI 29.69 kg/m   Wt Readings from Last 3 Encounters:  08/11/22 225 lb (102.1 kg)  06/17/21 235 lb (106.6 kg)  03/26/21 225 lb (102.1 kg)    Physical Exam Vitals and nursing note reviewed.  Constitutional:      General: He is not in acute distress.    Appearance: He is well-developed. He is not diaphoretic.  Eyes:     General: No scleral icterus.    Conjunctiva/sclera: Conjunctivae normal.  Neck:     Thyroid: No thyromegaly.  Cardiovascular:     Rate and Rhythm: Normal rate and regular rhythm.     Heart sounds: Normal heart sounds. No murmur heard. Pulmonary:     Effort: Pulmonary effort is normal. No respiratory distress.     Breath sounds: Normal breath sounds. No wheezing.  Musculoskeletal:        General: No swelling. Normal range of motion.     Cervical back: Neck supple.  Lymphadenopathy:     Cervical: No cervical adenopathy.  Skin:    General: Skin is warm and dry.     Findings: No rash.  Neurological:     Mental Status: He is alert and oriented to person, place, and time.     Coordination: Coordination normal.  Psychiatric:        Behavior: Behavior normal.       Assessment & Plan:   Problem List Items Addressed This Visit       Cardiovascular and Mediastinum   Essential hypertension, benign - Primary   Relevant Medications   atorvastatin (LIPITOR) 20 MG tablet   lisinopril (ZESTRIL) 10 MG tablet   metoprolol succinate (TOPROL-XL) 25 MG 24 hr tablet   Other Relevant Orders   CBC with Differential/Platelet   CMP14+EGFR     Other   Hyperlipidemia LDL goal <130   Relevant Medications   atorvastatin (LIPITOR) 20 MG tablet   lisinopril (ZESTRIL) 10 MG tablet   metoprolol succinate (TOPROL-XL) 25 MG 24 hr tablet   Other Relevant Orders   CBC with Differential/Platelet   CMP14+EGFR   Lipid panel   Anxiety   Relevant  Medications   escitalopram (LEXAPRO) 20 MG tablet   Other Visit Diagnoses     Need for Tdap vaccination       Relevant Orders   Tdap vaccine greater than or equal to 7yo IM  Continue current medicine, seems to be doing well. Follow up plan: Return in about 6 months (around 02/10/2023), or if symptoms worsen or fail to improve, for Physical and hypertension and cholesterol and anxiety.  Counseling provided for all of the vaccine components Orders Placed This Encounter  Procedures   Tdap vaccine greater than or equal to 7yo IM   CBC with Differential/Platelet   CMP14+EGFR   Lipid panel    Caryl Pina, MD [facility] 08/11/2022, 1:37 PM

## 2022-08-12 LAB — LIPID PANEL
Chol/HDL Ratio: 6.3 ratio — ABNORMAL HIGH (ref 0.0–5.0)
Cholesterol, Total: 169 mg/dL (ref 100–199)
HDL: 27 mg/dL — ABNORMAL LOW (ref >39)
LDL Chol Calc (NIH): 53 mg/dL (ref 0–99)
Triglycerides: 604 mg/dL (ref 0–149)
VLDL Cholesterol Cal: 89 mg/dL — ABNORMAL HIGH (ref 5–40)

## 2022-08-12 LAB — CBC WITH DIFFERENTIAL/PLATELET
Basophils Absolute: 0.1 10*3/uL (ref 0.0–0.2)
Basos: 1 %
EOS (ABSOLUTE): 0.3 10*3/uL (ref 0.0–0.4)
Eos: 3 %
Hematocrit: 46.5 % (ref 37.5–51.0)
Hemoglobin: 16 g/dL (ref 13.0–17.7)
Immature Grans (Abs): 0 10*3/uL (ref 0.0–0.1)
Immature Granulocytes: 0 %
Lymphocytes Absolute: 2.5 10*3/uL (ref 0.7–3.1)
Lymphs: 23 %
MCH: 30.9 pg (ref 26.6–33.0)
MCHC: 34.4 g/dL (ref 31.5–35.7)
MCV: 90 fL (ref 79–97)
Monocytes Absolute: 0.8 10*3/uL (ref 0.1–0.9)
Monocytes: 7 %
Neutrophils Absolute: 7.3 10*3/uL — ABNORMAL HIGH (ref 1.4–7.0)
Neutrophils: 66 %
Platelets: 222 10*3/uL (ref 150–450)
RBC: 5.18 x10E6/uL (ref 4.14–5.80)
RDW: 12.9 % (ref 11.6–15.4)
WBC: 10.9 10*3/uL — ABNORMAL HIGH (ref 3.4–10.8)

## 2022-08-12 LAB — CMP14+EGFR
ALT: 31 IU/L (ref 0–44)
AST: 22 IU/L (ref 0–40)
Albumin/Globulin Ratio: 1.7 (ref 1.2–2.2)
Albumin: 4.7 g/dL (ref 4.1–5.1)
Alkaline Phosphatase: 115 IU/L (ref 44–121)
BUN/Creatinine Ratio: 12 (ref 9–20)
BUN: 13 mg/dL (ref 6–24)
Bilirubin Total: 0.2 mg/dL (ref 0.0–1.2)
CO2: 28 mmol/L (ref 20–29)
Calcium: 9.9 mg/dL (ref 8.7–10.2)
Chloride: 99 mmol/L (ref 96–106)
Creatinine, Ser: 1.05 mg/dL (ref 0.76–1.27)
Globulin, Total: 2.7 g/dL (ref 1.5–4.5)
Glucose: 93 mg/dL (ref 70–99)
Potassium: 4.8 mmol/L (ref 3.5–5.2)
Sodium: 140 mmol/L (ref 134–144)
Total Protein: 7.4 g/dL (ref 6.0–8.5)
eGFR: 91 mL/min/{1.73_m2} (ref >59)

## 2023-02-10 ENCOUNTER — Encounter: Payer: Commercial Managed Care - PPO | Admitting: Family Medicine

## 2023-02-18 ENCOUNTER — Encounter: Payer: Commercial Managed Care - PPO | Admitting: Family Medicine

## 2023-02-21 ENCOUNTER — Encounter: Payer: Self-pay | Admitting: Family Medicine

## 2023-08-27 ENCOUNTER — Other Ambulatory Visit: Payer: Self-pay | Admitting: Family Medicine

## 2023-08-27 DIAGNOSIS — E785 Hyperlipidemia, unspecified: Secondary | ICD-10-CM

## 2023-09-17 ENCOUNTER — Other Ambulatory Visit: Payer: Self-pay | Admitting: Family Medicine

## 2023-09-17 DIAGNOSIS — I10 Essential (primary) hypertension: Secondary | ICD-10-CM

## 2023-09-18 ENCOUNTER — Other Ambulatory Visit: Payer: Self-pay | Admitting: Family Medicine

## 2023-09-18 DIAGNOSIS — I10 Essential (primary) hypertension: Secondary | ICD-10-CM

## 2023-09-19 ENCOUNTER — Other Ambulatory Visit: Payer: Self-pay | Admitting: Family Medicine

## 2023-09-19 DIAGNOSIS — F419 Anxiety disorder, unspecified: Secondary | ICD-10-CM

## 2023-09-19 MED ORDER — LISINOPRIL 10 MG PO TABS: 10.0000 mg | ORAL_TABLET | Freq: Every day | ORAL | 0 refills | Status: DC

## 2023-09-19 NOTE — Telephone Encounter (Signed)
[place] NTBS Last OV 08/11/22 NO RF sent to pharmacy since last OV over a year ago.

## 2023-09-19 NOTE — Addendum Note (Signed)
Addended by: Julious Payer D on: 09/19/2023 11:48 AM   Modules accepted: Orders

## 2023-09-19 NOTE — Telephone Encounter (Signed)
Pt has appt on 10/12/2023 but needs refill please send to pharm

## 2023-10-11 ENCOUNTER — Encounter: Payer: Self-pay | Admitting: Family Medicine

## 2023-10-11 ENCOUNTER — Ambulatory Visit: Payer: Commercial Managed Care - PPO | Admitting: Family Medicine

## 2023-10-11 VITALS — BP 132/77 | HR 73 | Ht 73.0 in | Wt 216.0 lb

## 2023-10-11 DIAGNOSIS — E781 Pure hyperglyceridemia: Secondary | ICD-10-CM

## 2023-10-11 DIAGNOSIS — I10 Essential (primary) hypertension: Secondary | ICD-10-CM

## 2023-10-11 DIAGNOSIS — F419 Anxiety disorder, unspecified: Secondary | ICD-10-CM | POA: Diagnosis not present

## 2023-10-11 DIAGNOSIS — E785 Hyperlipidemia, unspecified: Secondary | ICD-10-CM | POA: Diagnosis not present

## 2023-10-11 MED ORDER — SERTRALINE HCL 50 MG PO TABS: 50.0000 mg | ORAL_TABLET | Freq: Every day | ORAL | 1 refills | Status: DC

## 2023-10-11 MED ORDER — ATORVASTATIN CALCIUM 20 MG PO TABS: 20.0000 mg | ORAL_TABLET | Freq: Every day | ORAL | 3 refills | Status: DC

## 2023-10-11 MED ORDER — LISINOPRIL 10 MG PO TABS: 10.0000 mg | ORAL_TABLET | Freq: Every day | ORAL | 3 refills | Status: DC

## 2023-10-11 MED ORDER — METOPROLOL SUCCINATE ER 25 MG PO TB24: 25.0000 mg | ORAL_TABLET | Freq: Every day | ORAL | 3 refills | Status: DC

## 2023-10-11 NOTE — Progress Notes (Signed)
BP 132/77   Pulse 73   Ht 6\' 1"  (1.854 m)   Wt 216 lb (98 kg)   SpO2 98%   BMI 28.50 kg/m    Subjective:   Patient ID: Thomas York, male    DOB: 08-May-1981, 42 y.o.   MRN: [id number]  HPI: SANATH SANE is a 42 y.o. male presenting on 10/11/2023 for Medical Management of Chronic Issues, Hyperlipidemia, Hypertension, and Anxiety   HPI Hypertension Patient is currently on lisinopril metoprolol, and their blood pressure today is 132/77. Patient denies any lightheadedness or dizziness. Patient denies headaches, blurred vision, chest pains, shortness of breath, or weakness. Denies any side effects from medication and is content with current medication.   Hyperlipidemia Patient is coming in for recheck of his hyperlipidemia. The patient is currently taking Lipitor. They deny any issues with myalgias or history of liver damage from it. They deny any focal numbness or weakness or chest pain.   Anxiety recheck Patient is coming in today for anxiety recheck.  Currently takes Lexapro.  He says he still has a lot of worries and stresses especially related to work and family issues and feels like the Lexapro has helped but not helping completely and would like to try something different.  He denies any suicidal ideations or thoughts of hurting himself.    10/11/2023    2:48 PM 10/11/2023    2:47 PM 08/11/2022    1:28 PM 06/17/2021    9:18 AM 03/26/2021    3:45 PM  Depression screen PHQ 2/9  Decreased Interest  0 0 0 0  Down, Depressed, Hopeless  0 0 0 0  PHQ - 2 Score  0 0 0 0  Altered sleeping   0    Tired, decreased energy 0  1    Change in appetite 0  0    Feeling bad or failure about yourself  0  0    Trouble concentrating 0  0    Moving slowly or fidgety/restless 0  0    Suicidal thoughts 0  0    PHQ-9 Score   1    Difficult doing work/chores Not difficult at all  Somewhat difficult       Relevant past medical, surgical, family and social history reviewed and updated as  indicated. Interim medical history since our last visit reviewed. Allergies and medications reviewed and updated.  Review of Systems  Constitutional:  Negative for chills and fever.  Eyes:  Negative for visual disturbance.  Respiratory:  Negative for shortness of breath and wheezing.   Cardiovascular:  Negative for chest pain and leg swelling.  Skin:  Negative for rash.  Psychiatric/Behavioral:  Positive for dysphoric mood. Negative for self-injury, sleep disturbance and suicidal ideas. The patient is nervous/anxious.   All other systems reviewed and are negative.   Per HPI unless specifically indicated above   Allergies as of 10/11/2023       Reactions   Penicillins Hives, Swelling, Rash        Medication List        Accurate as of October 11, 2023  3:06 PM. If you have any questions, ask your nurse or doctor.          STOP taking these medications    escitalopram 20 MG tablet Commonly known as: LEXAPRO Stopped by: Elige Radon Ryin Ambrosius       TAKE these medications    atorvastatin 20 MG tablet Commonly known as: LIPITOR Take 1 tablet (20  mg total) by mouth daily.   fluticasone 50 MCG/ACT nasal spray Commonly known as: FLONASE Place 2 sprays into both nostrils daily.   HYDROcodone-acetaminophen 10-325 MG tablet Commonly known as: NORCO Take 1 tablet by mouth 3 (three) times daily as needed. Pt states that he takes 1/2 tablet daily   lisinopril 10 MG tablet Commonly known as: ZESTRIL Take 1 tablet (10 mg total) by mouth daily.   metoprolol succinate 25 MG 24 hr tablet Commonly known as: TOPROL-XL Take 1 tablet (25 mg total) by mouth daily.   sertraline 50 MG tablet Commonly known as: Zoloft Take 1 tablet (50 mg total) by mouth daily. Started by: Elige Radon Kristia Jupiter         Objective:   BP 132/77   Pulse 73   Ht 6\' 1"  (1.854 m)   Wt 216 lb (98 kg)   SpO2 98%   BMI 28.50 kg/m   Wt Readings from Last 3 Encounters:  10/11/23 216 lb (98 kg)   08/11/22 225 lb (102.1 kg)  06/17/21 235 lb (106.6 kg)    Physical Exam Vitals and nursing note reviewed.  Constitutional:      General: He is not in acute distress.    Appearance: He is well-developed. He is not diaphoretic.  Eyes:     General: No scleral icterus.    Conjunctiva/sclera: Conjunctivae normal.  Neck:     Thyroid: No thyromegaly.  Cardiovascular:     Rate and Rhythm: Normal rate and regular rhythm.     Heart sounds: Normal heart sounds. No murmur heard. Pulmonary:     Effort: Pulmonary effort is normal. No respiratory distress.     Breath sounds: Normal breath sounds. No wheezing.  Abdominal:     General: Abdomen is flat. Bowel sounds are normal. There is no distension.     Tenderness: There is no abdominal tenderness. There is no guarding or rebound.  Musculoskeletal:        General: Normal range of motion.     Cervical back: Neck supple.  Lymphadenopathy:     Cervical: No cervical adenopathy.  Skin:    General: Skin is warm and dry.     Findings: No rash.  Neurological:     Mental Status: He is alert and oriented to person, place, and time.     Coordination: Coordination normal.  Psychiatric:        Mood and Affect: Mood is anxious and depressed.        Behavior: Behavior normal.        Thought Content: Thought content does not include suicidal ideation. Thought content does not include suicidal plan.       Assessment & Plan:   Problem List Items Addressed This Visit       Cardiovascular and Mediastinum   Essential hypertension, benign   Relevant Medications   atorvastatin (LIPITOR) 20 MG tablet   lisinopril (ZESTRIL) 10 MG tablet   metoprolol succinate (TOPROL-XL) 25 MG 24 hr tablet   Other Relevant Orders   CMP14+EGFR     Other   Hyperlipidemia LDL goal <130   Relevant Medications   atorvastatin (LIPITOR) 20 MG tablet   lisinopril (ZESTRIL) 10 MG tablet   metoprolol succinate (TOPROL-XL) 25 MG 24 hr tablet   Other Relevant Orders    CBC with Differential/Platelet   CMP14+EGFR   Lipid panel   Hypertriglyceridemia - Primary   Relevant Medications   atorvastatin (LIPITOR) 20 MG tablet   lisinopril (ZESTRIL) 10 MG tablet  metoprolol succinate (TOPROL-XL) 25 MG 24 hr tablet   Other Relevant Orders   CBC with Differential/Platelet   CMP14+EGFR   Lipid panel   Anxiety   Relevant Medications   sertraline (ZOLOFT) 50 MG tablet    Will switch to Zoloft instead of Lexapro and see how it does for him.  Will do blood work today.  Otherwise continue current medicine. Follow up plan: Return if symptoms worsen or fail to improve, for 2 to 38-month anxiety recheck.  Counseling provided for all of the vaccine components Orders Placed This Encounter  Procedures   CBC with Differential/Platelet   CMP14+EGFR   Lipid panel    Arville Care, MD [facility] 10/11/2023, 3:06 PM

## 2023-10-12 ENCOUNTER — Ambulatory Visit: Payer: Commercial Managed Care - PPO | Admitting: Family Medicine

## 2023-10-12 LAB — CBC WITH DIFFERENTIAL/PLATELET
Basophils Absolute: 0.1 10*3/uL (ref 0.0–0.2)
Basos: 1 %
EOS (ABSOLUTE): 0.3 10*3/uL (ref 0.0–0.4)
Eos: 3 %
Hematocrit: 47.5 % (ref 37.5–51.0)
Hemoglobin: 15.9 g/dL (ref 13.0–17.7)
Immature Grans (Abs): 0 10*3/uL (ref 0.0–0.1)
Immature Granulocytes: 0 %
Lymphocytes Absolute: 2.8 10*3/uL (ref 0.7–3.1)
Lymphs: 25 %
MCH: 30.8 pg (ref 26.6–33.0)
MCHC: 33.5 g/dL (ref 31.5–35.7)
MCV: 92 fL (ref 79–97)
Monocytes Absolute: 0.9 10*3/uL (ref 0.1–0.9)
Monocytes: 8 %
Neutrophils Absolute: 7.2 10*3/uL — ABNORMAL HIGH (ref 1.4–7.0)
Neutrophils: 63 %
Platelets: 215 10*3/uL (ref 150–450)
RBC: 5.16 x10E6/uL (ref 4.14–5.80)
RDW: 12.1 % (ref 11.6–15.4)
WBC: 11.3 10*3/uL — ABNORMAL HIGH (ref 3.4–10.8)

## 2023-10-12 LAB — LIPID PANEL
Chol/HDL Ratio: 6.6 ratio — ABNORMAL HIGH (ref 0.0–5.0)
Cholesterol, Total: 185 mg/dL (ref 100–199)
HDL: 28 mg/dL — ABNORMAL LOW (ref >39)
LDL Chol Calc (NIH): 91 mg/dL (ref 0–99)
Triglycerides: 396 mg/dL — ABNORMAL HIGH (ref 0–149)
VLDL Cholesterol Cal: 66 mg/dL — ABNORMAL HIGH (ref 5–40)

## 2023-10-12 LAB — CMP14+EGFR
ALT: 37 [IU]/L (ref 0–44)
AST: 29 [IU]/L (ref 0–40)
Albumin: 4.7 g/dL (ref 4.1–5.1)
Alkaline Phosphatase: 121 [IU]/L (ref 44–121)
BUN/Creatinine Ratio: 13 (ref 9–20)
BUN: 10 mg/dL (ref 6–24)
Bilirubin Total: 0.3 mg/dL (ref 0.0–1.2)
CO2: 25 mmol/L (ref 20–29)
Calcium: 9.8 mg/dL (ref 8.7–10.2)
Chloride: 100 mmol/L (ref 96–106)
Creatinine, Ser: 0.78 mg/dL (ref 0.76–1.27)
Globulin, Total: 2.4 g/dL (ref 1.5–4.5)
Glucose: 64 mg/dL — ABNORMAL LOW (ref 70–99)
Potassium: 4.5 mmol/L (ref 3.5–5.2)
Sodium: 142 mmol/L (ref 134–144)
Total Protein: 7.1 g/dL (ref 6.0–8.5)
eGFR: 114 mL/min/{1.73_m2} (ref >59)

## 2023-10-14 ENCOUNTER — Encounter: Payer: Self-pay | Admitting: Family Medicine

## 2023-10-14 ENCOUNTER — Telehealth: Payer: Commercial Managed Care - PPO | Admitting: Family Medicine

## 2023-10-14 DIAGNOSIS — J069 Acute upper respiratory infection, unspecified: Secondary | ICD-10-CM

## 2023-10-14 NOTE — Progress Notes (Signed)
Virtual Visit via Video Note  I connected with Thomas York on 10/14/23 at  5:00 PM EDT by a video enabled telemedicine application and verified that I am speaking with the correct person using two identifiers.  Patient Location: Home Provider Location: Office/Clinic  I discussed the limitations, risks, security, and privacy concerns of performing an evaluation and management service by video and the availability of in person appointments. I also discussed with the patient that there may be a patient responsible charge related to this service. The patient expressed understanding and agreed to proceed.  Subjective: PCP: Dettinger, Elige Radon, MD  No chief complaint on file.  HPI States that it started 2-3 day ago with wheezing, congestion, sinus pressure. States that he can only hear wheeze when he is lying down at night and taking deep breaths. Denies trouble breathing. States that it resolves with sitting up. States that he is coughing green phlegm. Denies fever. States that he is taking HTN-friendly daytime and nighttime cold medicine. He is also using nasal spray, unsure of what kind. No recent surgeries. States that he was given prednisone for his symptoms in the past, but that it caused his BP to increase and he does not wish to have them again.   ROS: Per HPI  Current Outpatient Medications:    atorvastatin (LIPITOR) 20 MG tablet, Take 1 tablet (20 mg total) by mouth daily., Disp: 90 tablet, Rfl: 3   fluticasone (FLONASE) 50 MCG/ACT nasal spray, Place 2 sprays into both nostrils daily., Disp: 16 g, Rfl: 6   HYDROcodone-acetaminophen (NORCO) 10-325 MG tablet, Take 1 tablet by mouth 3 (three) times daily as needed. Pt states that he takes 1/2 tablet daily, Disp: , Rfl:    lisinopril (ZESTRIL) 10 MG tablet, Take 1 tablet (10 mg total) by mouth daily., Disp: 90 tablet, Rfl: 3   metoprolol succinate (TOPROL-XL) 25 MG 24 hr tablet, Take 1 tablet (25 mg total) by mouth daily., Disp: 90  tablet, Rfl: 3   sertraline (ZOLOFT) 50 MG tablet, Take 1 tablet (50 mg total) by mouth daily., Disp: 90 tablet, Rfl: 1  Observations/Objective: There were no vitals filed for this visit. Physical Exam Constitutional:      General: He is awake. He is not in acute distress.    Appearance: Normal appearance. He is well-developed and well-groomed. He is not ill-appearing, toxic-appearing or diaphoretic.  HENT:     Nose: Congestion present.  Pulmonary:     Effort: Pulmonary effort is normal.  Neurological:     General: No focal deficit present.     Mental Status: He is alert and oriented to person, place, and time.  Psychiatric:        Attention and Perception: Attention and perception normal.        Mood and Affect: Mood and affect normal.        Speech: Speech normal.        Behavior: Behavior normal. Behavior is cooperative.        Thought Content: Thought content normal.        Cognition and Memory: Cognition and memory normal.        Judgment: Judgment normal.    Assessment and Plan: 1. Viral upper respiratory tract infection Discussed with patient likely viral in etiology at this point. Patient to continue at home supportive treatments. Discussed that patient can take at home covid test. Explained that tests available in office are not rapid and at the time the results came in,  patient would not be eligible for treatment of flu or covid.   Appt time 10/14/2023 05:00 PM Call duration: 00:06:45   Follow Up Instructions: Return if symptoms worsen or fail to improve.   I discussed the assessment and treatment plan with the patient. The patient was provided an opportunity to ask questions, and all were answered. The patient agreed with the plan and demonstrated an understanding of the instructions.   The patient was advised to call back or seek an in-person evaluation if the symptoms worsen or if the condition fails to improve as anticipated.  The above assessment and management  plan was discussed with the patient. The patient verbalized understanding of and has agreed to the management plan.   Neale Burly, DNP-FNP [facility] [address] [place], [place] [address] [phone]

## 2023-10-14 NOTE — Patient Instructions (Signed)

## 2023-12-12 ENCOUNTER — Encounter: Payer: Self-pay | Admitting: Family Medicine

## 2023-12-12 ENCOUNTER — Ambulatory Visit: Payer: Commercial Managed Care - PPO | Admitting: Family Medicine

## 2023-12-24 ENCOUNTER — Other Ambulatory Visit: Payer: Self-pay | Admitting: Family Medicine

## 2023-12-24 DIAGNOSIS — I10 Essential (primary) hypertension: Secondary | ICD-10-CM

## 2023-12-27 ENCOUNTER — Other Ambulatory Visit: Payer: Self-pay | Admitting: Family Medicine

## 2023-12-27 DIAGNOSIS — F419 Anxiety disorder, unspecified: Secondary | ICD-10-CM

## 2024-01-10 ENCOUNTER — Other Ambulatory Visit: Payer: Self-pay | Admitting: Family Medicine

## 2024-01-10 DIAGNOSIS — I10 Essential (primary) hypertension: Secondary | ICD-10-CM

## 2024-01-16 ENCOUNTER — Telehealth: Payer: Self-pay | Admitting: Family Medicine

## 2024-01-16 NOTE — Telephone Encounter (Signed)
Copied from CRM [id number]. Topic: Clinical - Medical Advice >> Jan 16, 2024  3:58 PM Fredrica W wrote: Reason for CRM: Patient called states provider recently prescribed Zoloft patient stopped taking it due to feeling angrier and would like to know if there is something else that can be prescribed or what provider suggest. Thank You

## 2024-01-16 NOTE — Telephone Encounter (Signed)
Patient was seen in October for this, please make an appointment ASAP so he can come in and we can discuss options and what happened with the Zoloft so I can assess what options would be best for him.

## 2024-01-17 NOTE — Telephone Encounter (Signed)
Left message making pt aware that he will need to call the office and schedule an appt to discuss medication changes.

## 2024-01-18 NOTE — Telephone Encounter (Signed)
 Pt scheduled for Monday. LS

## 2024-01-19 ENCOUNTER — Ambulatory Visit: Payer: Commercial Managed Care - PPO | Admitting: Family Medicine

## 2024-01-23 ENCOUNTER — Ambulatory Visit: Payer: Commercial Managed Care - PPO | Admitting: Family Medicine

## 2024-01-23 VITALS — BP 134/80 | HR 74 | Ht 73.0 in | Wt 226.0 lb

## 2024-01-23 DIAGNOSIS — E785 Hyperlipidemia, unspecified: Secondary | ICD-10-CM

## 2024-01-23 DIAGNOSIS — I10 Essential (primary) hypertension: Secondary | ICD-10-CM

## 2024-01-23 DIAGNOSIS — E781 Pure hyperglyceridemia: Secondary | ICD-10-CM

## 2024-01-23 DIAGNOSIS — F419 Anxiety disorder, unspecified: Secondary | ICD-10-CM

## 2024-01-23 LAB — LIPID PANEL

## 2024-01-23 MED ORDER — METOPROLOL SUCCINATE ER 25 MG PO TB24: 25.0000 mg | ORAL_TABLET | Freq: Every day | ORAL | 3 refills | Status: AC

## 2024-01-23 MED ORDER — LISINOPRIL 10 MG PO TABS: 10.0000 mg | ORAL_TABLET | Freq: Every day | ORAL | 3 refills | Status: AC

## 2024-01-23 MED ORDER — BUPROPION HCL ER (XL) 150 MG PO TB24: 150.0000 mg | ORAL_TABLET | Freq: Every day | ORAL | 2 refills | Status: DC

## 2024-01-23 MED ORDER — ATORVASTATIN CALCIUM 20 MG PO TABS: 20.0000 mg | ORAL_TABLET | Freq: Every day | ORAL | 3 refills | Status: AC

## 2024-01-23 NOTE — Progress Notes (Signed)
 BP 134/80   Pulse 74   Ht 6\' 1"  (1.854 m)   Wt 226 lb (102.5 kg)   SpO2 99%   BMI 29.82 kg/m    Subjective:   Patient ID: Thomas York, male    DOB: 1981-09-19, 43 y.o.   MRN: [id number]  HPI: Thomas York is a 43 y.o. male presenting on 01/23/2024 for Medical Management of Chronic Issues, Anxiety, Hypertension, and Hyperlipidemia   HPI Hyperlipidemia and hypertriglyceridemia recheck Patient is coming in for recheck of his hyperlipidemia. The patient is currently taking atorvastatin . They deny any issues with myalgias or history of liver damage from it. They deny any focal numbness or weakness or chest pain.   Hypertension Patient is currently on lisinopril  and metoprolol , and their blood pressure today is 134/80. Patient denies any lightheadedness or dizziness. Patient denies headaches, blurred vision, chest pains, shortness of breath, or weakness. Denies any side effects from medication and is content with current medication.   Anxiety recheck Patient is coming in today for anxiety recheck.  He says he is not doing as well with his anxiety.  He still feels well stressors specially when he is at home and not working.  He says he will get sweats and feel his heart start racing and then when he goes out for a walk it comes down.  He said he did try the Zoloft  for about 2 months felt like it caused him to be more angry and irritable and did not like it and then eventually stopped it.    01/23/2024    4:26 PM 10/11/2023    2:48 PM 10/11/2023    2:47 PM 08/11/2022    1:28 PM 06/17/2021    9:18 AM  Depression screen PHQ 2/9  Decreased Interest 0  0 0 0  Down, Depressed, Hopeless 1  0 0 0  PHQ - 2 Score 1  0 0 0  Altered sleeping 2   0   Tired, decreased energy 0 0  1   Change in appetite 1 0  0   Feeling bad or failure about yourself  0 0  0   Trouble concentrating 1 0  0   Moving slowly or fidgety/restless 0 0  0   Suicidal thoughts 0 0  0   PHQ-9 Score 5   1   Difficult  doing work/chores Somewhat difficult Not difficult at all  Somewhat difficult      Relevant past medical, surgical, family and social history reviewed and updated as indicated. Interim medical history since our last visit reviewed. Allergies and medications reviewed and updated.  Review of Systems  Constitutional:  Negative for chills and fever.  Eyes:  Negative for visual disturbance.  Respiratory:  Negative for shortness of breath and wheezing.   Cardiovascular:  Negative for chest pain and leg swelling.  Musculoskeletal:  Negative for back pain and gait problem.  Skin:  Negative for rash.  Neurological:  Negative for dizziness and weakness.  Psychiatric/Behavioral:  Positive for dysphoric mood. Negative for self-injury, sleep disturbance and suicidal ideas. The patient is nervous/anxious.   All other systems reviewed and are negative.   Per HPI unless specifically indicated above   Allergies as of 01/23/2024       Reactions   Penicillins Hives, Swelling, Rash        Medication List        Accurate as of January 23, 2024  4:53 PM. If you have any questions, ask your  nurse or doctor.          STOP taking these medications    fluticasone  50 MCG/ACT nasal spray Commonly known as: FLONASE  Stopped by: Lucio Sabin Wanita Derenzo   sertraline  50 MG tablet Commonly known as: Zoloft  Stopped by: Lucio Sabin Mikinzie Maciejewski       TAKE these medications    atorvastatin  20 MG tablet Commonly known as: LIPITOR Take 1 tablet (20 mg total) by mouth daily.   buPROPion  150 MG 24 hr tablet Commonly known as: Wellbutrin  XL Take 1 tablet (150 mg total) by mouth daily. Started by: Lucio Sabin Addisyn Leclaire   HYDROcodone -acetaminophen  10-325 MG tablet Commonly known as: NORCO Take 1 tablet by mouth 3 (three) times daily as needed. Pt states that he takes 1/2 tablet daily   lisinopril  10 MG tablet Commonly known as: ZESTRIL  Take 1 tablet (10 mg total) by mouth daily.   metoprolol  succinate  25 MG 24 hr tablet Commonly known as: TOPROL -XL Take 1 tablet (25 mg total) by mouth daily.         Objective:   BP 134/80   Pulse 74   Ht 6\' 1"  (1.854 m)   Wt 226 lb (102.5 kg)   SpO2 99%   BMI 29.82 kg/m   Wt Readings from Last 3 Encounters:  01/23/24 226 lb (102.5 kg)  10/11/23 216 lb (98 kg)  08/11/22 225 lb (102.1 kg)    Physical Exam Vitals and nursing note reviewed.  Constitutional:      General: He is not in acute distress.    Appearance: He is well-developed. He is not diaphoretic.  Eyes:     General: No scleral icterus.    Conjunctiva/sclera: Conjunctivae normal.  Neck:     Thyroid: No thyromegaly.  Cardiovascular:     Rate and Rhythm: Normal rate and regular rhythm.     Heart sounds: Normal heart sounds. No murmur heard. Pulmonary:     Effort: Pulmonary effort is normal. No respiratory distress.     Breath sounds: Normal breath sounds. No wheezing.  Musculoskeletal:        General: No swelling. Normal range of motion.     Cervical back: Neck supple.  Lymphadenopathy:     Cervical: No cervical adenopathy.  Skin:    General: Skin is warm and dry.     Findings: No rash.  Neurological:     Mental Status: He is alert and oriented to person, place, and time.     Coordination: Coordination normal.  Psychiatric:        Mood and Affect: Mood is anxious and depressed.        Behavior: Behavior normal.        Thought Content: Thought content does not include suicidal ideation. Thought content does not include suicidal plan.       Assessment & Plan:   Problem List Items Addressed This Visit       Cardiovascular and Mediastinum   Essential hypertension, benign   Relevant Medications   atorvastatin  (LIPITOR) 20 MG tablet   lisinopril  (ZESTRIL ) 10 MG tablet   metoprolol  succinate (TOPROL -XL) 25 MG 24 hr tablet   Other Relevant Orders   CBC with Differential/Platelet   CMP14+EGFR   Lipid panel     Other   Hyperlipidemia LDL goal <130 - Primary    Relevant Medications   atorvastatin  (LIPITOR) 20 MG tablet   lisinopril  (ZESTRIL ) 10 MG tablet   metoprolol  succinate (TOPROL -XL) 25 MG 24 hr tablet   Other Relevant  Orders   CBC with Differential/Platelet   CMP14+EGFR   Lipid panel   Hypertriglyceridemia   Relevant Medications   atorvastatin  (LIPITOR) 20 MG tablet   lisinopril  (ZESTRIL ) 10 MG tablet   metoprolol  succinate (TOPROL -XL) 25 MG 24 hr tablet   Other Relevant Orders   CBC with Differential/Platelet   CMP14+EGFR   Lipid panel   Anxiety   Relevant Medications   buPROPion  (WELLBUTRIN  XL) 150 MG 24 hr tablet    Continue current medicine, will check blood work today.  Just added the Wellbutrin  for anxiety depression and we will see how that does for him, follow-up in 2 months if he is not doing well Follow up plan: Return in about 6 months (around 07/22/2024), or if symptoms worsen or fail to improve, for htn and hld.  Counseling provided for all of the vaccine components Orders Placed This Encounter  Procedures   CBC with Differential/Platelet   CMP14+EGFR   Lipid panel    Jolyne Needs, MD [facility] 01/23/2024, 4:53 PM

## 2024-01-24 LAB — CBC WITH DIFFERENTIAL/PLATELET
Basophils Absolute: 0.1 10*3/uL (ref 0.0–0.2)
Basos: 1 %
EOS (ABSOLUTE): 0.3 10*3/uL (ref 0.0–0.4)
Eos: 3 %
Hematocrit: 44.6 % (ref 37.5–51.0)
Hemoglobin: 15.5 g/dL (ref 13.0–17.7)
Immature Grans (Abs): 0 10*3/uL (ref 0.0–0.1)
Immature Granulocytes: 0 %
Lymphocytes Absolute: 3.4 10*3/uL — ABNORMAL HIGH (ref 0.7–3.1)
Lymphs: 30 %
MCH: 30.9 pg (ref 26.6–33.0)
MCHC: 34.8 g/dL (ref 31.5–35.7)
MCV: 89 fL (ref 79–97)
Monocytes Absolute: 0.9 10*3/uL (ref 0.1–0.9)
Monocytes: 8 %
Neutrophils Absolute: 6.7 10*3/uL (ref 1.4–7.0)
Neutrophils: 58 %
Platelets: 232 10*3/uL (ref 150–450)
RBC: 5.01 x10E6/uL (ref 4.14–5.80)
RDW: 12.7 % (ref 11.6–15.4)
WBC: 11.4 10*3/uL — ABNORMAL HIGH (ref 3.4–10.8)

## 2024-01-24 LAB — CMP14+EGFR
ALT: 43 IU/L (ref 0–44)
AST: 22 IU/L (ref 0–40)
Albumin: 4.2 g/dL (ref 4.1–5.1)
Alkaline Phosphatase: 94 IU/L (ref 44–121)
BUN/Creatinine Ratio: 15 (ref 9–20)
BUN: 12 mg/dL (ref 6–24)
Bilirubin Total: 0.2 mg/dL (ref 0.0–1.2)
CO2: 26 mmol/L (ref 20–29)
Calcium: 9.5 mg/dL (ref 8.7–10.2)
Chloride: 100 mmol/L (ref 96–106)
Creatinine, Ser: 0.8 mg/dL (ref 0.76–1.27)
Globulin, Total: 2.5 g/dL (ref 1.5–4.5)
Glucose: 93 mg/dL (ref 70–99)
Potassium: 4.4 mmol/L (ref 3.5–5.2)
Sodium: 141 mmol/L (ref 134–144)
Total Protein: 6.7 g/dL (ref 6.0–8.5)
eGFR: 113 mL/min/{1.73_m2} (ref >59)

## 2024-01-24 LAB — LIPID PANEL
Cholesterol, Total: 163 mg/dL (ref 100–199)
HDL: 32 mg/dL — ABNORMAL LOW (ref >39)
LDL CALC COMMENT:: 5.1 ratio — ABNORMAL HIGH (ref 0.0–5.0)
LDL Chol Calc (NIH): 62 mg/dL (ref 0–99)
Triglycerides: 450 mg/dL — ABNORMAL HIGH (ref 0–149)
VLDL Cholesterol Cal: 69 mg/dL — ABNORMAL HIGH (ref 5–40)

## 2024-01-26 ENCOUNTER — Encounter: Payer: Self-pay | Admitting: Family Medicine

## 2024-02-02 ENCOUNTER — Ambulatory Visit: Payer: Commercial Managed Care - PPO | Admitting: Family Medicine

## 2024-04-10 ENCOUNTER — Encounter: Payer: Self-pay | Admitting: Family Medicine

## 2024-04-10 ENCOUNTER — Ambulatory Visit: Admitting: Family Medicine

## 2024-04-10 VITALS — BP 126/71 | HR 78 | Temp 98.5°F | Ht 73.0 in | Wt 216.0 lb

## 2024-04-10 DIAGNOSIS — L0591 Pilonidal cyst without abscess: Secondary | ICD-10-CM

## 2024-04-10 MED ORDER — DOXYCYCLINE HYCLATE 100 MG PO TABS: 100.0000 mg | ORAL_TABLET | Freq: Two times a day (BID) | ORAL | 0 refills | Status: AC

## 2024-04-10 NOTE — Progress Notes (Signed)
 Subjective:  Patient ID: Thomas York, male    DOB: [birth year], 43 y.o.   MRN: [id number]  Patient Care Team: Dettinger, Lucio Sabin, MD as PCP - General (Family Medicine)   Chief Complaint:  bump on skin (Buttocks/With puss/With occasional pain)   HPI: Thomas York is a 43 y.o. male presenting on 04/10/2024 for bump on skin (Buttocks/With puss/With occasional pain)  HPI States that he noticed it 5 days ago.  Noticed pain, then felt bump that grew fast.  He states that he is taking epsom salt baths. He is not trying to drain it.  States that it is draining a pus, clear, green.  Denies fever, nausea, vomiting.   Relevant past medical, surgical, family, and social history reviewed and updated as indicated.  Allergies and medications reviewed and updated. Data reviewed: Chart in Epic.   Past Medical History:  Diagnosis Date   Anxiety    takes Valium  daily as needed   Chronic back pain    HNP    GERD (gastroesophageal reflux disease)    has taken Zantac  daily   History of kidney stones    Hyperlipemia    takes Lipitor and Fenofibrate  daily   Hypertension    takes Lisinopril  daily   Insomnia    coming from being in pain   Weakness    numbness and tingling down right leg    Past Surgical History:  Procedure Laterality Date   HERNIA REPAIR Right    inguinal    LUMBAR LAMINECTOMY/DECOMPRESSION MICRODISCECTOMY N/A 05/28/2015   Procedure: L3-S1 HEMILAMINOTOMY, DECOMPRESSION, DISCECTOMY  (3  LEVELS);  Surgeon: Mort Ards, MD;  Location: [facility] OR;  Service: Orthopedics;  Laterality: N/A;    Social History   Socioeconomic History   Marital status: Single    Spouse name: Not on file   Number of children: Not on file   Years of education: Not on file   Highest education level: 12th grade  Occupational History   Not on file  Tobacco Use   Smoking status: Never   Smokeless tobacco: Never  Vaping Use   Vaping status: Never Used  Substance and Sexual Activity    Alcohol use: Yes    Comment: rare   Drug use: No   Sexual activity: Yes    Birth control/protection: Condom    Comment: male partner for 1 year  Other Topics Concern   Not on file  Social History Narrative   Not on file   Social Drivers of Health   Financial Resource Strain: Medium Risk (01/23/2024)   Overall Financial Resource Strain (CARDIA)    Difficulty of Paying Living Expenses: Somewhat hard  Food Insecurity: No Food Insecurity (01/23/2024)   Hunger Vital Sign    Worried About Running Out of Food in the Last Year: Never true    Ran Out of Food in the Last Year: Never true  Transportation Needs: No Transportation Needs (01/23/2024)   PRAPARE - Administrator, Civil Service (Medical): No    Lack of Transportation (Non-Medical): No  Physical Activity: Sufficiently Active (01/23/2024)   Exercise Vital Sign    Days of Exercise per Week: 5 days    Minutes of Exercise per Session: 30 min  Stress: Stress Concern Present (01/23/2024)   [employer] of Occupational Health - Occupational Stress Questionnaire    Feeling of Stress : Very much  Social Connections: Moderately Isolated (01/23/2024)   Social Connection and Isolation Panel [NHANES]  Frequency of Communication with Friends and Family: More than three times a week    Frequency of Social Gatherings with Friends and Family: Twice a week    Attends Religious Services: More than 4 times per year    Active Member of [employer] or Organizations: No    Attends Banker Meetings: Not on file    Marital Status: Never married  Intimate Partner Violence: Not on file    Outpatient Encounter Medications as of 04/10/2024  Medication Sig   atorvastatin  (LIPITOR) 20 MG tablet Take 1 tablet (20 mg total) by mouth daily.   buPROPion  (WELLBUTRIN  XL) 150 MG 24 hr tablet Take 1 tablet (150 mg total) by mouth daily.   ibuprofen  (ADVIL ) 800 MG tablet Take 800 mg by mouth every 8 (eight) hours as needed.   lisinopril   (ZESTRIL ) 10 MG tablet Take 1 tablet (10 mg total) by mouth daily.   metoprolol  succinate (TOPROL -XL) 25 MG 24 hr tablet Take 1 tablet (25 mg total) by mouth daily.   [DISCONTINUED] HYDROcodone -acetaminophen  (NORCO) 10-325 MG tablet Take 1 tablet by mouth 3 (three) times daily as needed. Pt states that he takes 1/2 tablet daily   No facility-administered encounter medications on file as of 04/10/2024.    Allergies  Allergen Reactions   Penicillins Hives, Swelling and Rash    Review of Systems As per HPI  Objective:  BP 126/71   Pulse 78   Temp 98.5 F (36.9 C)   Ht 6\' 1"  (1.854 m)   Wt 216 lb (98 kg)   SpO2 97%   BMI 28.50 kg/m    Wt Readings from Last 3 Encounters:  04/10/24 216 lb (98 kg)  01/23/24 226 lb (102.5 kg)  10/11/23 216 lb (98 kg)    Physical Exam Constitutional:      General: He is awake. He is not in acute distress.    Appearance: Normal appearance. He is well-developed and well-groomed. He is not ill-appearing, toxic-appearing or diaphoretic.  Cardiovascular:     Rate and Rhythm: Normal rate and regular rhythm.     Heart sounds: Normal heart sounds. No murmur heard.    No gallop.  Pulmonary:     Effort: Pulmonary effort is normal. No respiratory distress.     Breath sounds: Normal breath sounds. No stridor. No wheezing, rhonchi or rales.  Musculoskeletal:     Cervical back: Full passive range of motion without pain and neck supple.     Right lower leg: No edema.  Skin:    General: Skin is warm.     Capillary Refill: Capillary refill takes less than 2 seconds.          Comments: ~1.5 inch, hard, tender, raised, erythematous abscess  Neurological:     General: No focal deficit present.     Mental Status: He is alert, oriented to person, place, and time and easily aroused. Mental status is at baseline.     GCS: GCS eye subscore is 4. GCS verbal subscore is 5. GCS motor subscore is 6.     Motor: No weakness.  Psychiatric:        Attention and  Perception: Attention and perception normal.        Mood and Affect: Mood and affect normal.        Speech: Speech normal.        Behavior: Behavior normal. Behavior is cooperative.        Thought Content: Thought content normal. Thought content does not include  homicidal or suicidal ideation. Thought content does not include homicidal or suicidal plan.        Cognition and Memory: Cognition and memory normal.        Judgment: Judgment normal.     Results for orders placed or performed in visit on 01/23/24  CBC with Differential/Platelet   Collection Time: 01/23/24  5:06 PM  Result Value Ref Range   WBC 11.4 (H) 3.4 - 10.8 x10E3/uL   RBC 5.01 4.14 - 5.80 x10E6/uL   Hemoglobin 15.5 13.0 - 17.7 g/dL   Hematocrit 16.1 09.6 - 51.0 %   MCV 89 79 - 97 fL   MCH 30.9 26.6 - 33.0 pg   MCHC 34.8 31.5 - 35.7 g/dL   RDW 04.5 40.9 - 81.1 %   Platelets 232 150 - 450 x10E3/uL   Neutrophils 58 Not Estab. %   Lymphs 30 Not Estab. %   Monocytes 8 Not Estab. %   Eos 3 Not Estab. %   Basos 1 Not Estab. %   Neutrophils Absolute 6.7 1.4 - 7.0 x10E3/uL   Lymphocytes Absolute 3.4 (H) 0.7 - 3.1 x10E3/uL   Monocytes Absolute 0.9 0.1 - 0.9 x10E3/uL   EOS (ABSOLUTE) 0.3 0.0 - 0.4 x10E3/uL   Basophils Absolute 0.1 0.0 - 0.2 x10E3/uL   Immature Granulocytes 0 Not Estab. %   Immature Grans (Abs) 0.0 0.0 - 0.1 x10E3/uL  CMP14+EGFR   Collection Time: 01/23/24  5:06 PM  Result Value Ref Range   Glucose 93 70 - 99 mg/dL   BUN 12 6 - 24 mg/dL   Creatinine, Ser 9.14 0.76 - 1.27 mg/dL   eGFR 782 >95 AO/ZHY/8.65   BUN/Creatinine Ratio 15 9 - 20   Sodium 141 134 - 144 mmol/L   Potassium 4.4 3.5 - 5.2 mmol/L   Chloride 100 96 - 106 mmol/L   CO2 26 20 - 29 mmol/L   Calcium  9.5 8.7 - 10.2 mg/dL   Total Protein 6.7 6.0 - 8.5 g/dL   Albumin 4.2 4.1 - 5.1 g/dL   Globulin, Total 2.5 1.5 - 4.5 g/dL   Bilirubin Total 0.2 0.0 - 1.2 mg/dL   Alkaline Phosphatase 94 44 - 121 IU/L   AST 22 0 - 40 IU/L   ALT 43 0 -  44 IU/L  Lipid panel   Collection Time: 01/23/24  5:06 PM  Result Value Ref Range   Cholesterol, Total 163 100 - 199 mg/dL   Triglycerides 784 (H) 0 - 149 mg/dL   HDL 32 (L) >69 mg/dL   VLDL Cholesterol Cal 69 (H) 5 - 40 mg/dL   LDL Chol Calc (NIH) 62 0 - 99 mg/dL   Chol/HDL Ratio 5.1 (H) 0.0 - 5.0 ratio       01/23/2024    4:26 PM 10/11/2023    2:48 PM 10/11/2023    2:47 PM 08/11/2022    1:28 PM 06/17/2021    9:18 AM  Depression screen PHQ 2/9  Decreased Interest 0  0 0 0  Down, Depressed, Hopeless 1  0 0 0  PHQ - 2 Score 1  0 0 0  Altered sleeping 2   0   Tired, decreased energy 0 0  1   Change in appetite 1 0  0   Feeling bad or failure about yourself  0 0  0   Trouble concentrating 1 0  0   Moving slowly or fidgety/restless 0 0  0   Suicidal thoughts 0 0  0  PHQ-9 Score 5   1   Difficult doing work/chores Somewhat difficult Not difficult at all  Somewhat difficult        01/23/2024    4:27 PM 10/11/2023    2:47 PM 08/11/2022    1:28 PM 06/17/2021    9:32 AM  GAD 7 : Generalized Anxiety Score  Nervous, Anxious, on Edge 1 0 1 1  Control/stop worrying 1 1 1 1   Worry too much - different things 1 1 1 1   Trouble relaxing 1 1 0 0  Restless 0 0 0 0  Easily annoyed or irritable 1 0 0 0  Afraid - awful might happen 0 1 0 0  Total GAD 7 Score 5 4 3 3   Anxiety Difficulty Somewhat difficult Not difficult at all     I & D  Date/Time: 04/10/2024 4:44 PM  Performed by: Chrystine Crate, FNP Authorized by: Chrystine Crate, FNP   Consent:    Consent obtained:  Written   Consent given by:  Patient   Risks, benefits, and alternatives were discussed: yes     Risks discussed:  Bleeding, incomplete drainage, pain and infection   Alternatives discussed:  Delayed treatment Location:    Type:  Pilonidal cyst Pre-procedure details:    Skin preparation:  Alcohol and povidone-iodine Sedation:    Sedation type:  None Anesthesia:    Anesthesia method:  Local  infiltration   Local anesthetic:  Lidocaine  2% WITH epi Procedure type:    Complexity:  Simple Procedure details:    Needle aspiration: no     Incision types:  Stab incision   Incision depth:  Submucosal   Scalpel blade:  11   Wound management:  Probed and deloculated   Drainage:  Bloody and purulent   Drainage amount:  Moderate   Wound treatment:  Wound left open Post-procedure details:    Procedure completion:  Tolerated well, no immediate complications   Pertinent labs & imaging results that were available during my care of the patient were reviewed by me and considered in my medical decision making.  Assessment & Plan:  Thomas "Jolan Natal" was seen today for bump on skin.  Diagnoses and all orders for this visit:  Pilonidal cyst I&D completed today.  Discussed with patient at home care.  Will start abx as below. Will have close follow up.  -     doxycycline  (VIBRA -TABS) 100 MG tablet; Take 1 tablet (100 mg total) by mouth 2 (two) times daily for 7 days. -     I & D     Continue all other maintenance medications.  Follow up plan: Return in about 1 week (around 04/17/2024).   Continue healthy lifestyle choices, including diet (rich in fruits, vegetables, and lean proteins, and low in salt and simple carbohydrates) and exercise (at least 30 minutes of moderate physical activity daily).  Written and verbal instructions provided   The above assessment and management plan was discussed with the patient. The patient verbalized understanding of and has agreed to the management plan. Patient is aware to call the clinic if they develop any new symptoms or if symptoms persist or worsen. Patient is aware when to return to the clinic for a follow-up visit. Patient educated on when it is appropriate to go to the emergency department.   Jacqualyn Mates, DNP-FNP [facility] [address] [place], [place] [address] [phone]

## 2024-04-12 ENCOUNTER — Telehealth: Payer: Self-pay

## 2024-04-12 NOTE — Telephone Encounter (Signed)
 Copied from CRM [id number]. Topic: Clinical - Medical Advice >> Apr 12, 2024 10:22 AM Thomas York H wrote: Reason for CRM: Patient has an appointment a few days ago and his mother states that his cyst is coming back and had a red pocket, patient needs to know if he needs to come in or just wait for his appointment. Patients mother Fate Honor callback number is [phone].

## 2024-04-13 NOTE — Telephone Encounter (Signed)
 Pt's mother advised. States that pain is not bad and not draining. Will keep appointment to follow up. LS

## 2024-04-19 ENCOUNTER — Ambulatory Visit: Admitting: Family Medicine

## 2024-04-19 ENCOUNTER — Encounter: Payer: Self-pay | Admitting: Family Medicine

## 2024-04-19 VITALS — BP 138/80 | HR 78 | Temp 98.7°F | Ht 73.0 in | Wt 211.0 lb

## 2024-04-19 DIAGNOSIS — L0591 Pilonidal cyst without abscess: Secondary | ICD-10-CM

## 2024-04-19 MED ORDER — DOXYCYCLINE HYCLATE 100 MG PO TABS: 100.0000 mg | ORAL_TABLET | Freq: Two times a day (BID) | ORAL | 0 refills | Status: AC

## 2024-04-19 NOTE — Progress Notes (Signed)
 Subjective:  Patient ID: Thomas York, male    DOB: 29-May-1981, 43 y.o.   MRN: [id number]  Patient Care Team: Dettinger, Lucio Sabin, MD as PCP - General (Family Medicine)   Chief Complaint:  Cyst (Follow up I&D)  HPI: Thomas York is a 43 y.o. male presenting on 04/19/2024 for Cyst (Follow up I&D)  HPI States that site is a lot better, but the last few days he has pressure and soreness below. States that he has had hemorrhoids in the past and this feels similar. Feels it more when he is sitting, getting into the car, coughing. Was straining with bowel movements and started taking fiber and now has soft stools. Denies fever, N/V.   Relevant past medical, surgical, family, and social history reviewed and updated as indicated.  Allergies and medications reviewed and updated. Data reviewed: Chart in Epic.   Past Medical History:  Diagnosis Date   Anxiety    takes Valium  daily as needed   Chronic back pain    HNP    GERD (gastroesophageal reflux disease)    has taken Zantac  daily   History of kidney stones    Hyperlipemia    takes Lipitor and Fenofibrate  daily   Hypertension    takes Lisinopril  daily   Insomnia    coming from being in pain   Weakness    numbness and tingling down right leg    Past Surgical History:  Procedure Laterality Date   HERNIA REPAIR Right    inguinal    LUMBAR LAMINECTOMY/DECOMPRESSION MICRODISCECTOMY N/A 05/28/2015   Procedure: L3-S1 HEMILAMINOTOMY, DECOMPRESSION, DISCECTOMY  (3  LEVELS);  Surgeon: Mort Ards, MD;  Location: [facility] OR;  Service: Orthopedics;  Laterality: N/A;    Social History   Socioeconomic History   Marital status: Single    Spouse name: Not on file   Number of children: Not on file   Years of education: Not on file   Highest education level: 12th grade  Occupational History   Not on file  Tobacco Use   Smoking status: Never   Smokeless tobacco: Never  Vaping Use   Vaping status: Never Used  Substance and Sexual  Activity   Alcohol use: Yes    Comment: rare   Drug use: No   Sexual activity: Yes    Birth control/protection: Condom    Comment: male partner for 1 year  Other Topics Concern   Not on file  Social History Narrative   Not on file   Social Drivers of Health   Financial Resource Strain: Medium Risk (01/23/2024)   Overall Financial Resource Strain (CARDIA)    Difficulty of Paying Living Expenses: Somewhat hard  Food Insecurity: No Food Insecurity (01/23/2024)   Hunger Vital Sign    Worried About Running Out of Food in the Last Year: Never true    Ran Out of Food in the Last Year: Never true  Transportation Needs: No Transportation Needs (01/23/2024)   PRAPARE - Administrator, Civil Service (Medical): No    Lack of Transportation (Non-Medical): No  Physical Activity: Sufficiently Active (01/23/2024)   Exercise Vital Sign    Days of Exercise per Week: 5 days    Minutes of Exercise per Session: 30 min  Stress: Stress Concern Present (01/23/2024)   [employer] of Occupational Health - Occupational Stress Questionnaire    Feeling of Stress : Very much  Social Connections: Moderately Isolated (01/23/2024)   Social Connection and Isolation Panel [  NHANES]    Frequency of Communication with Friends and Family: More than three times a week    Frequency of Social Gatherings with Friends and Family: Twice a week    Attends Religious Services: More than 4 times per year    Active Member of [employer] or Organizations: No    Attends Banker Meetings: Not on file    Marital Status: Never married  Intimate Partner Violence: Not on file    Outpatient Encounter Medications as of 04/19/2024  Medication Sig   atorvastatin  (LIPITOR) 20 MG tablet Take 1 tablet (20 mg total) by mouth daily.   buPROPion  (WELLBUTRIN  XL) 150 MG 24 hr tablet Take 1 tablet (150 mg total) by mouth daily.   ibuprofen  (ADVIL ) 800 MG tablet Take 800 mg by mouth every 8 (eight) hours as needed.    lisinopril  (ZESTRIL ) 10 MG tablet Take 1 tablet (10 mg total) by mouth daily.   metoprolol  succinate (TOPROL -XL) 25 MG 24 hr tablet Take 1 tablet (25 mg total) by mouth daily.   No facility-administered encounter medications on file as of 04/19/2024.    Allergies  Allergen Reactions   Penicillins Hives, Swelling and Rash    Review of Systems As per HPI  Objective:  BP 138/80   Pulse 78   Temp 98.7 F (37.1 C)   Ht 6\' 1"  (1.854 m)   Wt 211 lb (95.7 kg)   SpO2 97%   BMI 27.84 kg/m    Wt Readings from Last 3 Encounters:  04/19/24 211 lb (95.7 kg)  04/10/24 216 lb (98 kg)  01/23/24 226 lb (102.5 kg)    Physical Exam Constitutional:      General: He is awake. He is not in acute distress.    Appearance: Normal appearance. He is well-developed and well-groomed. He is not ill-appearing, toxic-appearing or diaphoretic.  Cardiovascular:     Rate and Rhythm: Normal rate and regular rhythm.     Heart sounds: Normal heart sounds. No murmur heard.    No gallop.  Pulmonary:     Effort: Pulmonary effort is normal. No respiratory distress.     Breath sounds: Normal breath sounds. No stridor. No wheezing, rhonchi or rales.  Musculoskeletal:     Cervical back: Full passive range of motion without pain and neck supple.  Skin:    General: Skin is warm.     Capillary Refill: Capillary refill takes less than 2 seconds.          Comments: Previous cyst closed, nontender, no longer draining, not erythematous  Second, smaller cyst below previous, raised, draining purulent fluid, erythematous, tender to palpation  Neurological:     General: No focal deficit present.     Mental Status: He is alert, oriented to person, place, and time and easily aroused. Mental status is at baseline.     GCS: GCS eye subscore is 4. GCS verbal subscore is 5. GCS motor subscore is 6.     Motor: No weakness.  Psychiatric:        Attention and Perception: Attention and perception normal.        Mood and Affect:  Mood and affect normal.        Speech: Speech normal.        Behavior: Behavior normal. Behavior is cooperative.        Thought Content: Thought content normal. Thought content does not include homicidal or suicidal ideation. Thought content does not include homicidal or suicidal plan.  Cognition and Memory: Cognition and memory normal.        Judgment: Judgment normal.     Results for orders placed or performed in visit on 01/23/24  CBC with Differential/Platelet   Collection Time: 01/23/24  5:06 PM  Result Value Ref Range   WBC 11.4 (H) 3.4 - 10.8 x10E3/uL   RBC 5.01 4.14 - 5.80 x10E6/uL   Hemoglobin 15.5 13.0 - 17.7 g/dL   Hematocrit 16.1 09.6 - 51.0 %   MCV 89 79 - 97 fL   MCH 30.9 26.6 - 33.0 pg   MCHC 34.8 31.5 - 35.7 g/dL   RDW 04.5 40.9 - 81.1 %   Platelets 232 150 - 450 x10E3/uL   Neutrophils 58 Not Estab. %   Lymphs 30 Not Estab. %   Monocytes 8 Not Estab. %   Eos 3 Not Estab. %   Basos 1 Not Estab. %   Neutrophils Absolute 6.7 1.4 - 7.0 x10E3/uL   Lymphocytes Absolute 3.4 (H) 0.7 - 3.1 x10E3/uL   Monocytes Absolute 0.9 0.1 - 0.9 x10E3/uL   EOS (ABSOLUTE) 0.3 0.0 - 0.4 x10E3/uL   Basophils Absolute 0.1 0.0 - 0.2 x10E3/uL   Immature Granulocytes 0 Not Estab. %   Immature Grans (Abs) 0.0 0.0 - 0.1 x10E3/uL  CMP14+EGFR   Collection Time: 01/23/24  5:06 PM  Result Value Ref Range   Glucose 93 70 - 99 mg/dL   BUN 12 6 - 24 mg/dL   Creatinine, Ser 9.14 0.76 - 1.27 mg/dL   eGFR 782 >95 AO/ZHY/8.65   BUN/Creatinine Ratio 15 9 - 20   Sodium 141 134 - 144 mmol/L   Potassium 4.4 3.5 - 5.2 mmol/L   Chloride 100 96 - 106 mmol/L   CO2 26 20 - 29 mmol/L   Calcium  9.5 8.7 - 10.2 mg/dL   Total Protein 6.7 6.0 - 8.5 g/dL   Albumin 4.2 4.1 - 5.1 g/dL   Globulin, Total 2.5 1.5 - 4.5 g/dL   Bilirubin Total 0.2 0.0 - 1.2 mg/dL   Alkaline Phosphatase 94 44 - 121 IU/L   AST 22 0 - 40 IU/L   ALT 43 0 - 44 IU/L  Lipid panel   Collection Time: 01/23/24  5:06 PM  Result  Value Ref Range   Cholesterol, Total 163 100 - 199 mg/dL   Triglycerides 784 (H) 0 - 149 mg/dL   HDL 32 (L) >69 mg/dL   VLDL Cholesterol Cal 69 (H) 5 - 40 mg/dL   LDL Chol Calc (NIH) 62 0 - 99 mg/dL   Chol/HDL Ratio 5.1 (H) 0.0 - 5.0 ratio       01/23/2024    4:26 PM 10/11/2023    2:48 PM 10/11/2023    2:47 PM 08/11/2022    1:28 PM 06/17/2021    9:18 AM  Depression screen PHQ 2/9  Decreased Interest 0  0 0 0  Down, Depressed, Hopeless 1  0 0 0  PHQ - 2 Score 1  0 0 0  Altered sleeping 2   0   Tired, decreased energy 0 0  1   Change in appetite 1 0  0   Feeling bad or failure about yourself  0 0  0   Trouble concentrating 1 0  0   Moving slowly or fidgety/restless 0 0  0   Suicidal thoughts 0 0  0   PHQ-9 Score 5   1   Difficult doing work/chores Somewhat difficult Not difficult at all  Somewhat difficult        01/23/2024    4:27 PM 10/11/2023    2:47 PM 08/11/2022    1:28 PM 06/17/2021    9:32 AM  GAD 7 : Generalized Anxiety Score  Nervous, Anxious, on Edge 1 0 1 1  Control/stop worrying 1 1 1 1   Worry too much - different things 1 1 1 1   Trouble relaxing 1 1 0 0  Restless 0 0 0 0  Easily annoyed or irritable 1 0 0 0  Afraid - awful might happen 0 1 0 0  Total GAD 7 Score 5 4 3 3   Anxiety Difficulty Somewhat difficult Not difficult at all     Pertinent labs & imaging results that were available during my care of the patient were reviewed by me and considered in my medical decision making.  Assessment & Plan:  Sean "Jolan Natal" was seen today for cyst.  Diagnoses and all orders for this visit:  Pilonidal cyst Will extend abx as below.  Discussed with patient to start cleansing with Hibiclens  Encouraged patient to complete warm soaks and drain abscess.  Discussed red flag symptoms and when to follow up.  -     doxycycline  (VIBRA -TABS) 100 MG tablet; Take 1 tablet (100 mg total) by mouth 2 (two) times daily for 7 days.     Continue all other maintenance  medications.  Follow up plan: Return if symptoms worsen or fail to improve.   Continue healthy lifestyle choices, including diet (rich in fruits, vegetables, and lean proteins, and low in salt and simple carbohydrates) and exercise (at least 30 minutes of moderate physical activity daily).  Written and verbal instructions provided   The above assessment and management plan was discussed with the patient. The patient verbalized understanding of and has agreed to the management plan. Patient is aware to call the clinic if they develop any new symptoms or if symptoms persist or worsen. Patient is aware when to return to the clinic for a follow-up visit. Patient educated on when it is appropriate to go to the emergency department.   Jacqualyn Mates, DNP-FNP [facility] [address] [place], [place] [address] [phone]

## 2024-04-19 NOTE — Patient Instructions (Signed)
 Hibiclens

## 2024-04-24 ENCOUNTER — Other Ambulatory Visit: Payer: Self-pay | Admitting: Family Medicine

## 2024-04-24 DIAGNOSIS — F419 Anxiety disorder, unspecified: Secondary | ICD-10-CM

## 2024-04-26 ENCOUNTER — Other Ambulatory Visit: Payer: Self-pay

## 2024-04-26 ENCOUNTER — Telehealth: Payer: Self-pay | Admitting: Family Medicine

## 2024-04-26 DIAGNOSIS — L0591 Pilonidal cyst without abscess: Secondary | ICD-10-CM

## 2024-04-26 NOTE — Telephone Encounter (Signed)
 Copied from CRM [id number]. Topic: Referral - Request for Referral >> Apr 26, 2024 10:23 AM Crispin Dolphin wrote: Did the patient discuss referral with their provider in the last year? No (If No - schedule appointment) (If Yes - send message)  Appointment offered? No  Type of order/referral and detailed reason for visit: Was seen for cyst and was told if it didn't get better provider would refer him for surgery. Cyst is still there   Preference of office, provider, location: N/A  If referral order, have you been seen by this specialty before? No (If Yes, this issue or another issue? When? Where?  Can we respond through MyChart? Yes

## 2024-04-26 NOTE — Telephone Encounter (Signed)
 It looks like Connell Degree saw him for this, go ahead and place a referral to general surgery for pilonidal cyst

## 2024-05-04 NOTE — Telephone Encounter (Signed)
 Patient would like update on status of this referral.

## 2024-05-08 NOTE — Telephone Encounter (Signed)
 I have sent Referral to [facility] in [place] at this time.  I also sent Patient a Wellsite geologist with Specialty Office contact information.

## 2024-05-31 ENCOUNTER — Ambulatory Visit: Admitting: General Surgery

## 2024-06-18 ENCOUNTER — Other Ambulatory Visit: Payer: Self-pay

## 2024-06-18 ENCOUNTER — Ambulatory Visit: Attending: Orthopedic Surgery | Admitting: Physical Therapy

## 2024-06-18 ENCOUNTER — Encounter: Payer: Self-pay | Admitting: Physical Therapy

## 2024-06-18 DIAGNOSIS — R293 Abnormal posture: Secondary | ICD-10-CM | POA: Insufficient documentation

## 2024-06-18 DIAGNOSIS — M5459 Other low back pain: Secondary | ICD-10-CM | POA: Diagnosis present

## 2024-06-18 NOTE — Therapy (Signed)
 OUTPATIENT PHYSICAL THERAPY THORACOLUMBAR EVALUATION   Patient Name: Thomas York MRN: [id number] DOB:1980-12-24, 43 y.o., male Today's Date: 06/18/2024  END OF SESSION:  PT End of Session - 06/18/24 1511     Visit Number 1    Number of Visits 4    Date for PT Re-Evaluation 07/16/24   Limiting visits due to high co-pay.   PT Start Time 0232    PT Stop Time 0325    PT Time Calculation (min) 53 min    Activity Tolerance Patient tolerated treatment well    Behavior During Therapy WFL for tasks assessed/performed          Past Medical History:  Diagnosis Date   Anxiety    takes Valium  daily as needed   Chronic back pain    HNP    GERD (gastroesophageal reflux disease)    has taken Zantac  daily   History of kidney stones    Hyperlipemia    takes Lipitor and Fenofibrate  daily   Hypertension    takes Lisinopril  daily   Insomnia    coming from being in pain   Weakness    numbness and tingling down right leg   Past Surgical History:  Procedure Laterality Date   HERNIA REPAIR Right    inguinal    LUMBAR LAMINECTOMY/DECOMPRESSION MICRODISCECTOMY N/A 05/28/2015   Procedure: L3-S1 HEMILAMINOTOMY, DECOMPRESSION, DISCECTOMY  (3  LEVELS);  Surgeon: Donaciano Sprang, MD;  Location: [facility] OR;  Service: Orthopedics;  Laterality: N/A;   Patient Active Problem List   Diagnosis Date Noted   Hypertriglyceridemia 10/11/2023   Anxiety 03/26/2021   Overweight (BMI 25.0-29.9) 01/25/2019   Hyperlipidemia LDL goal <130 11/01/2016   Essential hypertension, benign 11/01/2016    REFERRING PROVIDER: Donaciano Sprang MD  REFERRING DIAG: Low back pain.  Rationale for Evaluation and Treatment: Rehabilitation  THERAPY DIAG:  Other low back pain  Abnormal posture  ONSET DATE: About 6 weeks ago.    SUBJECTIVE:                                                                                                                                                                                            SUBJECTIVE STATEMENT: The patient presents to the clinic with a flare-up of low back pain about 6 weeks ago.  His pain today is a 5-6/10 but can rise to higher levels, especially when lying flat.  Sitting down decreases his pain.  He describes his pain as a shooting and ache.    PERTINENT HISTORY:  Prior lumbar discectomy (2015).    PAIN:  Are you having pain? Yes: NPRS scale: 5-6/10. Pain location:  Left low back/buttock.   Pain description: As above. Aggravating factors: As above. Relieving factors: As above.  PRECAUTIONS: None  RED FLAGS: None   WEIGHT BEARING RESTRICTIONS: No  FALLS:  Has patient fallen in last 6 months? No  LIVING ENVIRONMENT: Lives in: House/apartment Has following equipment at home: None  OCCUPATION: Works at [employer]  PLOF: Independent  PATIENT GOALS: Get back to where he was before the flare-up.      OBJECTIVE:   POSTURE: rounded shoulders, forward head, decreased lumbar lordosis, and flexed trunk .  Right trunk shift.  PALPATION: Tender to palpation over left SIJ and left upper gluteal musculature.    LUMBAR ROM:   Active flexion limited by 50% and extension to 15 degrees.  LOWER EXTREMITY ROM:     WNL.  LOWER EXTREMITY MMT:    Normal LE strength.  LUMBAR SPECIAL TESTS:  (-) SLR testing.  (+) FABER test on left.   GAIT: Antalgic gait walking in a flexed trunk with a right trunk shift.  TREATMENT DATE: 06/18/24:  IFC at 80-150 Hz on 40% scan x 20 minutes to patient's left low back region f/b therex/HEP.  S and DKTC (long hold. 1 minute+, hip bridges, standing elbow against wall extensions and trunk shift correct stretch.                                                                                                                                PATIENT EDUCATION:  Education details: See below.  Discussed sleeping with pillows, SI-Loc brace and TENS unit usage. Person educated: Patient Education method:  Explanation, Demonstration, Tactile cues, and Handouts Education comprehension: verbalized understanding and returned demonstration  HOME EXERCISE PROGRAM: SKTC  [[id number]]  Lateral Shift  -  Repeat 10 Repetitions, Hold 2 Seconds, Perform 2 Times a Day  SINGLE KNEE TO CHEST STRETCH - SKTC -  Repeat 3 Repetitions, Hold 1 Minute, Complete 1 Set, Perform 3 Times a Day  DOUBLE KNEE TO CHEST STRETCH - DKTC -  Repeat 2 Repetitions, Hold 1 Minute, Complete 1 Set, Perform 3 Times a Day  Bridges -  Repeat 15 Repetitions, Hold 2 Seconds, Complete 2 Sets, Perform 2 Times a Day  Elbow on wall lumbar extension movement.  ASSESSMENT:  CLINICAL IMPRESSION: The patient presents to OPPT with a flare-up of low back pain about 6 weeks ago.  He was in obvious pain and his posture is remarkable for a flexed and right-shifted trunk.  He is tender to palpation over left SIJ and left upper gluteal musculature. He demonstrated a positive left FABER test. He has normal LE strength.  He requested a HEP today in order to limit visits.  He did very well with these today and will call the clinic should he feel he needs another visit.     OBJECTIVE IMPAIRMENTS: Abnormal gait, decreased activity tolerance, decreased ROM, increased muscle spasms, postural dysfunction, and pain.   ACTIVITY LIMITATIONS: carrying,  lifting, bending, sleeping, and locomotion level  PARTICIPATION LIMITATIONS: laundry, occupation, and yard work  PERSONAL FACTORS: 1 comorbidity: prior lumbar surgery are also affecting patient's functional outcome.   REHAB POTENTIAL: Good  CLINICAL DECISION MAKING: Stable/uncomplicated  EVALUATION COMPLEXITY: Low   GOALS:   SHORT TERM GOALS: Target date: 07/16/24.  Ind with a HEP. Goal status: INITIAL    PLAN:  PT FREQUENCY: 1x/week  PT DURATION: 4 weeks  PLANNED INTERVENTIONS: 97110-Therapeutic exercises, 97530- Therapeutic activity, [id number]- Neuromuscular re-education, 97535- Self Care,  97140- Manual therapy, G0283- Electrical stimulation (unattended), 97035- Ultrasound, Patient/Family education, and Moist heat.  PLAN FOR NEXT SESSION: Core exercise progression, STW/M and modalities as needed.     Kenishia Plack, [place], PT 06/18/2024, 4:03 PM

## 2024-06-19 ENCOUNTER — Ambulatory Visit: Admitting: General Surgery

## 2024-07-04 ENCOUNTER — Ambulatory Visit: Payer: Self-pay

## 2024-07-04 NOTE — Telephone Encounter (Signed)
 FYI Only or Action Required?: FYI only for provider.  Patient was last seen in primary care on 04/19/2024 by Cathlene Marry Lenis, FNP.  Called Nurse Triage reporting Anxiety.  Symptoms began several weeks ago.  Interventions attempted: Prescription medications: welbutrin.  Symptoms are: gradually worsening.  Triage Disposition: See PCP When Office is Open (Within 3 Days)  Patient/caregiver understands and will follow disposition?: Yes  Copied from CRM #[id number]. Topic: Clinical - Red Word Triage >> Jul 04, 2024  2:35 PM Avram MATSU wrote: Red Word that prompted transfer to Nurse Triage: anxiety meds not working. Tried to schedule but was denied    ----------------------------------------------------------------------- From previous Reason for Contact - Scheduling: Patient/patient representative is calling to schedule an appointment. Refer to attachments for appointment information. Reason for Disposition  MODERATE anxiety (e.g., persistent or frequent anxiety symptoms; interferes with sleep, school, or work)  Answer Assessment - Initial Assessment Questions 1. CONCERN: Did anything happen that prompted you to call today?      Does not think that his medicine is working as well as it used to 2. ANXIETY SYMPTOMS: Can you describe how you (your loved one; patient) have been feeling? (e.g., tense, restless, panicky, anxious, keyed up, overwhelmed, sense of impending doom).      Patient states that he worries a lot, and sometimes feels shaky 3. ONSET: How long have you been feeling this way? (e.g., hours, days, weeks)     4-5 weeks ago 4. SEVERITY: How would you rate the level of anxiety? (e.g., 0 - 10; or mild, moderate, severe).     6/7 5. FUNCTIONAL IMPAIRMENT: How have these feelings affected your ability to do daily activities? Have you had more difficulty than usual doing your normal daily activities? (e.g., getting better, same, worse; self-care, school, work,  interactions)     Patient still going to work 6. HISTORY: Have you felt this way before? Have you ever been diagnosed with an anxiety problem in the past? (e.g., generalized anxiety disorder, panic attacks, PTSD). If Yes, ask: How was this problem treated? (e.g., medicines, counseling, etc.)     yes 7. RISK OF HARM - SUICIDAL IDEATION: Do you ever have thoughts of hurting or killing yourself? If Yes, ask:  Do you have these feelings now? Do you have a plan on how you would do this?     denies 8. TREATMENT:  What has been done so far to treat this anxiety? (e.g., medicines, relaxation strategies). What has helped?     welbutrin 9. THERAPIST: Do you have a counselor or therapist? If Yes, ask: What is their name?     denies  39. PATIENT SUPPORT: Who is with you now? Who do you live with? Do you have family or friends who you can talk to?        Patient states that his mother is supportiv 72. OTHER SYMPTOMS: Do you have any other symptoms? (e.g., feeling depressed, trouble concentrating, trouble sleeping, trouble breathing, palpitations or fast heartbeat, chest pain, sweating, nausea, or diarrhea)       Fast heartbeat at times and feels shaky  Protocols used: Anxiety and Panic Attack-A-AH

## 2024-07-04 NOTE — Telephone Encounter (Signed)
 Scheduled for tomorrow. LS

## 2024-07-05 ENCOUNTER — Encounter: Payer: Self-pay | Admitting: Family Medicine

## 2024-07-05 ENCOUNTER — Ambulatory Visit: Admitting: Family Medicine

## 2024-07-05 VITALS — BP 143/83 | HR 77 | Ht 73.0 in | Wt 210.0 lb

## 2024-07-05 DIAGNOSIS — F419 Anxiety disorder, unspecified: Secondary | ICD-10-CM

## 2024-07-05 MED ORDER — DULOXETINE HCL 30 MG PO CPEP: 30.0000 mg | ORAL_CAPSULE | Freq: Every day | ORAL | 3 refills | Status: DC

## 2024-07-05 NOTE — Progress Notes (Signed)
 BP (!) 143/83   Pulse 77   Ht 6' 1 (1.854 m)   Wt 210 lb (95.3 kg)   SpO2 100%   BMI 27.71 kg/m    Subjective:   Patient ID: Thomas York, male    DOB: 1981-10-04, 43 y.o.   MRN: [id number]  HPI: Thomas York is a 43 y.o. male presenting on 07/05/2024 for Medical Management of Chronic Issues, Anxiety, Depression, and Back Pain   HPI Anxiety and depression recheck Patient is coming in today for anxiety and depression recheck.  He is currently taking Wellbutrin .  He says the Wellbutrin  never really helped him and he wants to try something different.  He just feels like he is very anxious especially when he is alone and worries a lot and stresses a lot and not sleeping well, also not sleeping because of his pain.  Back pain Patient has been seeing a spinal specialist and he had an MRI that shows some nerve impingement and bulging disks.  Pacifically nerve impingement at L3 and L5.  They have been trying injections.  Relevant past medical, surgical, family and social history reviewed and updated as indicated. Interim medical history since our last visit reviewed. Allergies and medications reviewed and updated.  Review of Systems  Constitutional:  Negative for chills and fever.  Respiratory:  Negative for shortness of breath and wheezing.   Cardiovascular:  Negative for chest pain and leg swelling.  Musculoskeletal:  Positive for arthralgias and back pain. Negative for gait problem.  Skin:  Negative for rash.  Psychiatric/Behavioral:  Positive for dysphoric mood and sleep disturbance. Negative for self-injury and suicidal ideas. The patient is nervous/anxious.   All other systems reviewed and are negative.   Per HPI unless specifically indicated above   Allergies as of 07/05/2024       Reactions   Penicillins Hives, Swelling, Rash        Medication List        Accurate as of July 05, 2024  2:45 PM. If you have any questions, ask your nurse or doctor.           atorvastatin  20 MG tablet Commonly known as: LIPITOR Take 1 tablet (20 mg total) by mouth daily.   buPROPion  150 MG 24 hr tablet Commonly known as: WELLBUTRIN  XL Take 1 tablet by mouth once daily   DULoxetine  30 MG capsule Commonly known as: Cymbalta  Take 1 capsule (30 mg total) by mouth daily. Started by: Fonda LABOR Janalee Grobe   HYDROcodone -acetaminophen  10-325 MG tablet Commonly known as: NORCO Take 1 tablet by mouth every 6 (six) hours as needed.   ibuprofen  800 MG tablet Commonly known as: ADVIL  Take 800 mg by mouth every 8 (eight) hours as needed.   lisinopril  10 MG tablet Commonly known as: ZESTRIL  Take 1 tablet (10 mg total) by mouth daily.   metoprolol  succinate 25 MG 24 hr tablet Commonly known as: TOPROL -XL Take 1 tablet (25 mg total) by mouth daily.         Objective:   BP (!) 143/83   Pulse 77   Ht 6' 1 (1.854 m)   Wt 210 lb (95.3 kg)   SpO2 100%   BMI 27.71 kg/m   Wt Readings from Last 3 Encounters:  07/05/24 210 lb (95.3 kg)  04/19/24 211 lb (95.7 kg)  04/10/24 216 lb (98 kg)    Physical Exam Vitals and nursing note reviewed.  Constitutional:      General: He is not  in acute distress.    Appearance: He is well-developed. He is not diaphoretic.  Eyes:     General: No scleral icterus.    Conjunctiva/sclera: Conjunctivae normal.  Neck:     Thyroid: No thyromegaly.  Musculoskeletal:        General: Normal range of motion.  Skin:    General: Skin is warm and dry.     Findings: No rash.  Neurological:     Mental Status: He is alert and oriented to person, place, and time.     Coordination: Coordination normal.  Psychiatric:        Mood and Affect: Mood is anxious and depressed.        Behavior: Behavior normal.        Thought Content: Thought content does not include suicidal ideation. Thought content does not include suicidal plan.       Assessment & Plan:   Problem List Items Addressed This Visit       Other   Anxiety -  Primary   Relevant Medications   DULoxetine  (CYMBALTA ) 30 MG capsule    Instructed him to keep taking the Wellbutrin  for 2 more weeks then go ahead and start the Cymbalta  now and take them together for 2 weeks and then transition off the Wellbutrin . Follow up plan: Return if symptoms worsen or fail to improve, for 1 to 28-month anxiety follow-up.  Counseling provided for all of the vaccine components No orders of the defined types were placed in this encounter.   Fonda Levins, MD [facility] 07/05/2024, 2:45 PM

## 2024-07-16 ENCOUNTER — Encounter: Payer: Self-pay | Admitting: *Deleted

## 2024-07-18 ENCOUNTER — Ambulatory Visit: Admitting: General Surgery

## 2024-07-19 ENCOUNTER — Ambulatory Visit: Admitting: General Surgery

## 2024-07-23 ENCOUNTER — Telehealth: Payer: Self-pay | Admitting: Family Medicine

## 2024-07-23 DIAGNOSIS — F419 Anxiety disorder, unspecified: Secondary | ICD-10-CM

## 2024-07-23 MED ORDER — BUPROPION HCL ER (XL) 150 MG PO TB24: 150.0000 mg | ORAL_TABLET | Freq: Every day | ORAL | 0 refills | Status: DC

## 2024-07-23 NOTE — Telephone Encounter (Signed)
 Copied from CRM [id number]. Topic: Clinical - Prescription Issue >> Jul 23, 2024  3:17 PM Deaijah H wrote: Reason for CRM: Patient called in stating DULoxetine  (CYMBALTA ) 30 MG capsule caused blurred vision and want to know if he can take buPROPion  (WELLBUTRIN  XL) 150 MG 24 hr tablet. Please call [phone]

## 2024-07-23 NOTE — Telephone Encounter (Signed)
 Pt made aware. He has some Wellbutrin  but needs refill to make it until 8/29 appt with [facility]

## 2024-07-23 NOTE — Telephone Encounter (Signed)
 Yes that is fine, go ahead and have him switch back to the Wellbutrin  tablet and then make an appointment to come see me and we can discuss other options.

## 2024-08-10 ENCOUNTER — Encounter: Payer: Self-pay | Admitting: Family Medicine

## 2024-08-10 ENCOUNTER — Ambulatory Visit: Admitting: Family Medicine

## 2024-08-10 VITALS — BP 138/68 | HR 72 | Temp 98.1°F | Ht 73.0 in | Wt 210.0 lb

## 2024-08-10 DIAGNOSIS — F419 Anxiety disorder, unspecified: Secondary | ICD-10-CM

## 2024-08-10 DIAGNOSIS — E785 Hyperlipidemia, unspecified: Secondary | ICD-10-CM | POA: Diagnosis not present

## 2024-08-10 DIAGNOSIS — I10 Essential (primary) hypertension: Secondary | ICD-10-CM | POA: Diagnosis not present

## 2024-08-10 DIAGNOSIS — E781 Pure hyperglyceridemia: Secondary | ICD-10-CM | POA: Diagnosis not present

## 2024-08-10 MED ORDER — BUPROPION HCL ER (XL) 300 MG PO TB24: 300.0000 mg | ORAL_TABLET | Freq: Every day | ORAL | 1 refills | Status: AC

## 2024-08-10 NOTE — Progress Notes (Signed)
 BP 138/68   Pulse 72   Temp 98.1 F (36.7 C) (Temporal)   Ht 6' 1 (1.854 m)   Wt 210 lb (95.3 kg)   SpO2 97%   BMI 27.71 kg/m    Subjective:   Patient ID: Thomas York, male    DOB: Feb 03, 1981, 43 y.o.   MRN: [id number]  HPI: Thomas York is a 43 y.o. male presenting on 08/10/2024 for Anxiety   Discussed the use of AI scribe software for clinical note transcription with the patient, who gave verbal consent to proceed.  History of Present Illness   Thomas York is a 43 year old male with anxiety and depression who presents for a recheck.  He was previously taking Cymbalta  (duloxetine ) but discontinued it after two weeks due to side effects, including dizziness and faintness. He has been off Cymbalta  for a few weeks now. He continues to take Wellbutrin  at a dose of 150 mg, which has been beneficial without adverse effects.  His anxiety has improved slightly, with panic attacks occurring every three to four days, which is less frequent than before.  He mentions fluctuations in his blood pressure, with readings ranging from 122 to 140 systolic. His blood pressure tends to rise during periods of anxiety but returns to normal when he is relaxed.  He is currently taking atorvastatin  for cholesterol management.  He received two injections for back pain, which alleviated neuropathic symptoms such as a 'pin sticking' sensation in his toes.           08/10/2024    2:34 PM 07/05/2024    2:28 PM 01/23/2024    4:26 PM 10/11/2023    2:48 PM 10/11/2023    2:47 PM  Depression screen PHQ 2/9  Decreased Interest 0 1 0  0  Down, Depressed, Hopeless 0 1 1  0  PHQ - 2 Score 0 2 1  0  Altered sleeping 1 1 2     Tired, decreased energy 0 0 0 0   Change in appetite 0 1 1 0   Feeling bad or failure about yourself  0 0 0 0   Trouble concentrating 1 0 1 0   Moving slowly or fidgety/restless 0 0 0 0   Suicidal thoughts 0 0 0 0   PHQ-9 Score 2 4 5     Difficult doing work/chores Not  difficult at all Not difficult at all Somewhat difficult Not difficult at all        08/10/2024    2:35 PM 07/05/2024    2:28 PM 01/23/2024    4:27 PM 10/11/2023    2:47 PM  GAD 7 : Generalized Anxiety Score  Nervous, Anxious, on Edge 0 1 1 0  Control/stop worrying 1 1 1 1   Worry too much - different things 1 1 1 1   Trouble relaxing 0 1 1 1   Restless 0 1 0 0  Easily annoyed or irritable 0 0 1 0  Afraid - awful might happen 0 0 0 1  Total GAD 7 Score 2 5 5 4   Anxiety Difficulty Not difficult at all Somewhat difficult Somewhat difficult Not difficult at all       Relevant past medical, surgical, family and social history reviewed and updated as indicated. Interim medical history since our last visit reviewed. Allergies and medications reviewed and updated.  Review of Systems  Constitutional:  Negative for chills and fever.  Respiratory:  Negative for shortness of breath and wheezing.  Cardiovascular:  Negative for chest pain and leg swelling.  Musculoskeletal:  Negative for back pain and gait problem.  Skin:  Negative for rash.  Psychiatric/Behavioral:  Positive for agitation and dysphoric mood. Negative for self-injury, sleep disturbance and suicidal ideas. The patient is nervous/anxious.   All other systems reviewed and are negative.   Per HPI unless specifically indicated above   Allergies as of 08/10/2024       Reactions   Penicillins Hives, Swelling, Rash        Medication List        Accurate as of August 10, 2024  3:13 PM. If you have any questions, ask your nurse or doctor.          STOP taking these medications    DULoxetine  30 MG capsule Commonly known as: Cymbalta  Stopped by: Fonda LABOR Fardowsa Authier       TAKE these medications    atorvastatin  20 MG tablet Commonly known as: LIPITOR Take 1 tablet (20 mg total) by mouth daily.   buPROPion  300 MG 24 hr tablet Commonly known as: WELLBUTRIN  XL Take 1 tablet (300 mg total) by mouth daily. What  changed:  medication strength how much to take Changed by: Fonda LABOR Shavon Ashmore   HYDROcodone -acetaminophen  10-325 MG tablet Commonly known as: NORCO Take 1 tablet by mouth every 6 (six) hours as needed.   ibuprofen  800 MG tablet Commonly known as: ADVIL  Take 800 mg by mouth every 8 (eight) hours as needed.   lisinopril  10 MG tablet Commonly known as: ZESTRIL  Take 1 tablet (10 mg total) by mouth daily.   metoprolol  succinate 25 MG 24 hr tablet Commonly known as: TOPROL -XL Take 1 tablet (25 mg total) by mouth daily.         Objective:   BP 138/68   Pulse 72   Temp 98.1 F (36.7 C) (Temporal)   Ht 6' 1 (1.854 m)   Wt 210 lb (95.3 kg)   SpO2 97%   BMI 27.71 kg/m   Wt Readings from Last 3 Encounters:  08/10/24 210 lb (95.3 kg)  07/05/24 210 lb (95.3 kg)  04/19/24 211 lb (95.7 kg)    Physical Exam Physical Exam   VITALS: BP- 130/68 NECK: Thyroid normal, no thyromegaly. CHEST: Lungs clear to auscultation bilaterally. CARDIOVASCULAR: Regular heart rate and rhythm, no murmurs. EXTREMITIES: No edema, good peripheral pulses.         Assessment & Plan:   Problem List Items Addressed This Visit       Cardiovascular and Mediastinum   Essential hypertension, benign   Relevant Orders   CBC with Differential/Platelet   CMP14+EGFR   Lipid panel     Other   Hyperlipidemia LDL goal <130   Relevant Orders   CBC with Differential/Platelet   CMP14+EGFR   Lipid panel   Hypertriglyceridemia   Relevant Orders   CBC with Differential/Platelet   CMP14+EGFR   Lipid panel   Anxiety - Primary   Relevant Medications   buPROPion  (WELLBUTRIN  XL) 300 MG 24 hr tablet       Anxiety and depression Anxiety and depression improved with Wellbutrin  150 mg, but anxiety attacks persist. Discussed individual variability in medication effects. He opted to increase Wellbutrin  rather than switch medications. - Increase Wellbutrin  to 300 mg daily. Prescribe 300 mg tablets. - Follow  up in 1-2 months to assess response.  Hyperlipidemia Managed with atorvastatin . - Order blood work to check cholesterol, kidney, and liver function.  Peripheral neuropathy Symptoms improved with recent  injections, indicating effective targeting.      Follow up plan: Return if symptoms worsen or fail to improve, for 1 to 31-month anxiety recheck.  Counseling provided for all of the vaccine components Orders Placed This Encounter  Procedures   CBC with Differential/Platelet   CMP14+EGFR   Lipid panel    Fonda Levins, MD [facility] 08/10/2024, 3:13 PM

## 2024-08-11 LAB — LIPID PANEL
Chol/HDL Ratio: 5.5 ratio — ABNORMAL HIGH (ref 0.0–5.0)
Cholesterol, Total: 183 mg/dL (ref 100–199)
HDL: 33 mg/dL — ABNORMAL LOW (ref >39)
LDL Chol Calc (NIH): 91 mg/dL (ref 0–99)
Triglycerides: 353 mg/dL — ABNORMAL HIGH (ref 0–149)
VLDL Cholesterol Cal: 59 mg/dL — ABNORMAL HIGH (ref 5–40)

## 2024-08-11 LAB — CMP14+EGFR
ALT: 45 IU/L — ABNORMAL HIGH (ref 0–44)
AST: 23 IU/L (ref 0–40)
Albumin: 4.8 g/dL (ref 4.1–5.1)
Alkaline Phosphatase: 109 IU/L (ref 44–121)
BUN/Creatinine Ratio: 10 (ref 9–20)
BUN: 10 mg/dL (ref 6–24)
Bilirubin Total: 0.5 mg/dL (ref 0.0–1.2)
CO2: 26 mmol/L (ref 20–29)
Calcium: 10.1 mg/dL (ref 8.7–10.2)
Chloride: 97 mmol/L (ref 96–106)
Creatinine, Ser: 1.05 mg/dL (ref 0.76–1.27)
Globulin, Total: 2.8 g/dL (ref 1.5–4.5)
Glucose: 96 mg/dL (ref 70–99)
Potassium: 4.7 mmol/L (ref 3.5–5.2)
Sodium: 140 mmol/L (ref 134–144)
Total Protein: 7.6 g/dL (ref 6.0–8.5)
eGFR: 90 mL/min/1.73 (ref >59)

## 2024-08-11 LAB — CBC WITH DIFFERENTIAL/PLATELET
Basophils Absolute: 0.1 x10E3/uL (ref 0.0–0.2)
Basos: 0 %
EOS (ABSOLUTE): 0.3 x10E3/uL (ref 0.0–0.4)
Eos: 2 %
Hematocrit: 49.8 % (ref 37.5–51.0)
Hemoglobin: 16.7 g/dL (ref 13.0–17.7)
Immature Grans (Abs): 0.1 x10E3/uL (ref 0.0–0.1)
Immature Granulocytes: 1 %
Lymphocytes Absolute: 3.3 x10E3/uL — ABNORMAL HIGH (ref 0.7–3.1)
Lymphs: 26 %
MCH: 31 pg (ref 26.6–33.0)
MCHC: 33.5 g/dL (ref 31.5–35.7)
MCV: 93 fL (ref 79–97)
Monocytes Absolute: 1 x10E3/uL — ABNORMAL HIGH (ref 0.1–0.9)
Monocytes: 8 %
Neutrophils Absolute: 8.2 x10E3/uL — ABNORMAL HIGH (ref 1.4–7.0)
Neutrophils: 63 %
Platelets: 230 x10E3/uL (ref 150–450)
RBC: 5.38 x10E6/uL (ref 4.14–5.80)
RDW: 13.2 % (ref 11.6–15.4)
WBC: 12.8 x10E3/uL — ABNORMAL HIGH (ref 3.4–10.8)

## 2024-08-20 ENCOUNTER — Ambulatory Visit: Payer: Self-pay | Admitting: Family Medicine

## 2024-09-12 ENCOUNTER — Ambulatory Visit: Admitting: Family Medicine

## 2024-10-08 ENCOUNTER — Ambulatory Visit: Admitting: Family Medicine

## 2024-10-08 ENCOUNTER — Encounter: Payer: Self-pay | Admitting: Family Medicine
